# Patient Record
Sex: Male | Born: 1945 | Hispanic: Yes | Marital: Married | Smoking: Current some day smoker
Health system: Southern US, Community
[De-identification: ages and names within clinical notes are randomized; demographics above are authoritative.]

## PROBLEM LIST (undated history)

## (undated) DIAGNOSIS — K7460 Unspecified cirrhosis of liver: Secondary | ICD-10-CM

## (undated) DIAGNOSIS — I5189 Other ill-defined heart diseases: Secondary | ICD-10-CM

## (undated) DIAGNOSIS — E8809 Other disorders of plasma-protein metabolism, not elsewhere classified: Secondary | ICD-10-CM

## (undated) DIAGNOSIS — F1010 Alcohol abuse, uncomplicated: Secondary | ICD-10-CM

## (undated) DIAGNOSIS — Z9081 Acquired absence of spleen: Secondary | ICD-10-CM

## (undated) DIAGNOSIS — K766 Portal hypertension: Secondary | ICD-10-CM

## (undated) DIAGNOSIS — D649 Anemia, unspecified: Secondary | ICD-10-CM

## (undated) DIAGNOSIS — Z720 Tobacco use: Secondary | ICD-10-CM

## (undated) DIAGNOSIS — R768 Other specified abnormal immunological findings in serum: Secondary | ICD-10-CM

## (undated) DIAGNOSIS — L02213 Cutaneous abscess of chest wall: Secondary | ICD-10-CM

## (undated) DIAGNOSIS — K259 Gastric ulcer, unspecified as acute or chronic, without hemorrhage or perforation: Secondary | ICD-10-CM

## (undated) DIAGNOSIS — K3189 Other diseases of stomach and duodenum: Secondary | ICD-10-CM

## (undated) DIAGNOSIS — K2210 Ulcer of esophagus without bleeding: Secondary | ICD-10-CM

## (undated) DIAGNOSIS — A048 Other specified bacterial intestinal infections: Secondary | ICD-10-CM

## (undated) DIAGNOSIS — K254 Chronic or unspecified gastric ulcer with hemorrhage: Secondary | ICD-10-CM

---

## 1968-12-31 DIAGNOSIS — Z9081 Acquired absence of spleen: Secondary | ICD-10-CM

## 1968-12-31 DIAGNOSIS — K259 Gastric ulcer, unspecified as acute or chronic, without hemorrhage or perforation: Secondary | ICD-10-CM

## 1968-12-31 HISTORY — DX: Acquired absence of spleen: Z90.81

## 1968-12-31 HISTORY — DX: Gastric ulcer, unspecified as acute or chronic, without hemorrhage or perforation: K25.9

## 1969-05-02 HISTORY — PX: SPLENECTOMY, TOTAL: SHX788

## 2013-03-12 ENCOUNTER — Encounter (INDEPENDENT_AMBULATORY_CARE_PROVIDER_SITE_OTHER): Payer: Self-pay

## 2013-03-12 ENCOUNTER — Encounter: Payer: Self-pay | Admitting: Family Medicine

## 2013-03-12 ENCOUNTER — Inpatient Hospital Stay (HOSPITAL_COMMUNITY)
Admission: EM | Admit: 2013-03-12 | Discharge: 2013-03-15 | DRG: 378 | Disposition: A | Discharge status: Home / Self Care | Payer: Medicare Other | Attending: Internal Medicine | Admitting: Internal Medicine

## 2013-03-12 ENCOUNTER — Encounter (HOSPITAL_COMMUNITY): Payer: Self-pay | Admitting: Emergency Medicine

## 2013-03-12 ENCOUNTER — Ambulatory Visit (INDEPENDENT_AMBULATORY_CARE_PROVIDER_SITE_OTHER): Payer: Medicare Other | Admitting: Family Medicine

## 2013-03-12 ENCOUNTER — Emergency Department (HOSPITAL_COMMUNITY): Payer: Medicare Other

## 2013-03-12 VITALS — BP 142/92 | HR 103 | Temp 98.1°F | Ht 68.0 in | Wt 174.0 lb

## 2013-03-12 DIAGNOSIS — I5189 Other ill-defined heart diseases: Secondary | ICD-10-CM

## 2013-03-12 DIAGNOSIS — B1920 Unspecified viral hepatitis C without hepatic coma: Secondary | ICD-10-CM | POA: Diagnosis present

## 2013-03-12 DIAGNOSIS — K208 Other esophagitis without bleeding: Secondary | ICD-10-CM | POA: Diagnosis present

## 2013-03-12 DIAGNOSIS — K3189 Other diseases of stomach and duodenum: Secondary | ICD-10-CM

## 2013-03-12 DIAGNOSIS — K2100 Gastro-esophageal reflux disease with esophagitis, without bleeding: Secondary | ICD-10-CM | POA: Diagnosis present

## 2013-03-12 DIAGNOSIS — R600 Localized edema: Secondary | ICD-10-CM

## 2013-03-12 DIAGNOSIS — K921 Melena: Secondary | ICD-10-CM

## 2013-03-12 DIAGNOSIS — R188 Other ascites: Secondary | ICD-10-CM

## 2013-03-12 DIAGNOSIS — K922 Gastrointestinal hemorrhage, unspecified: Secondary | ICD-10-CM

## 2013-03-12 DIAGNOSIS — I864 Gastric varices: Secondary | ICD-10-CM

## 2013-03-12 DIAGNOSIS — K766 Portal hypertension: Secondary | ICD-10-CM | POA: Diagnosis present

## 2013-03-12 DIAGNOSIS — Z720 Tobacco use: Secondary | ICD-10-CM

## 2013-03-12 DIAGNOSIS — A048 Other specified bacterial intestinal infections: Secondary | ICD-10-CM

## 2013-03-12 DIAGNOSIS — K2210 Ulcer of esophagus without bleeding: Secondary | ICD-10-CM

## 2013-03-12 DIAGNOSIS — R768 Other specified abnormal immunological findings in serum: Secondary | ICD-10-CM

## 2013-03-12 DIAGNOSIS — E8809 Other disorders of plasma-protein metabolism, not elsewhere classified: Secondary | ICD-10-CM

## 2013-03-12 DIAGNOSIS — Z23 Encounter for immunization: Secondary | ICD-10-CM

## 2013-03-12 DIAGNOSIS — K254 Chronic or unspecified gastric ulcer with hemorrhage: Principal | ICD-10-CM

## 2013-03-12 DIAGNOSIS — F1010 Alcohol abuse, uncomplicated: Secondary | ICD-10-CM | POA: Diagnosis present

## 2013-03-12 DIAGNOSIS — K7030 Alcoholic cirrhosis of liver without ascites: Secondary | ICD-10-CM | POA: Diagnosis present

## 2013-03-12 DIAGNOSIS — R609 Edema, unspecified: Secondary | ICD-10-CM

## 2013-03-12 DIAGNOSIS — F17200 Nicotine dependence, unspecified, uncomplicated: Secondary | ICD-10-CM | POA: Diagnosis present

## 2013-03-12 DIAGNOSIS — K449 Diaphragmatic hernia without obstruction or gangrene: Secondary | ICD-10-CM | POA: Diagnosis present

## 2013-03-12 DIAGNOSIS — D62 Acute posthemorrhagic anemia: Secondary | ICD-10-CM

## 2013-03-12 DIAGNOSIS — E871 Hypo-osmolality and hyponatremia: Secondary | ICD-10-CM

## 2013-03-12 DIAGNOSIS — K319 Disease of stomach and duodenum, unspecified: Secondary | ICD-10-CM | POA: Diagnosis present

## 2013-03-12 DIAGNOSIS — K7460 Unspecified cirrhosis of liver: Secondary | ICD-10-CM

## 2013-03-12 DIAGNOSIS — F1020 Alcohol dependence, uncomplicated: Secondary | ICD-10-CM | POA: Diagnosis present

## 2013-03-12 HISTORY — DX: Other specified bacterial intestinal infections: A04.8

## 2013-03-12 HISTORY — DX: Gastric ulcer, unspecified as acute or chronic, without hemorrhage or perforation: K25.9

## 2013-03-12 HISTORY — DX: Other diseases of stomach and duodenum: K31.89

## 2013-03-12 HISTORY — DX: Other specified abnormal immunological findings in serum: R76.8

## 2013-03-12 HISTORY — DX: Alcohol abuse, uncomplicated: F10.10

## 2013-03-12 HISTORY — DX: Unspecified cirrhosis of liver: K74.60

## 2013-03-12 HISTORY — DX: Other disorders of plasma-protein metabolism, not elsewhere classified: E88.09

## 2013-03-12 HISTORY — DX: Other ill-defined heart diseases: I51.89

## 2013-03-12 HISTORY — DX: Anemia, unspecified: D64.9

## 2013-03-12 HISTORY — DX: Ulcer of esophagus without bleeding: K22.10

## 2013-03-12 HISTORY — DX: Chronic or unspecified gastric ulcer with hemorrhage: K25.4

## 2013-03-12 HISTORY — DX: Acquired absence of spleen: Z90.81

## 2013-03-12 HISTORY — DX: Portal hypertension: K76.6

## 2013-03-12 HISTORY — DX: Tobacco use: Z72.0

## 2013-03-12 LAB — CBC WITH DIFFERENTIAL/PLATELET
Basophils Relative: 0 % (ref 0–1)
Eosinophils Absolute: 0.1 10*3/uL (ref 0.0–0.7)
Hemoglobin: 12.9 g/dL — ABNORMAL LOW (ref 13.0–17.0)
Lymphocytes Relative: 33 % (ref 12–46)
Lymphs Abs: 2.3 10*3/uL (ref 0.7–4.0)
MCH: 35 pg — ABNORMAL HIGH (ref 26.0–34.0)
MCHC: 35 g/dL (ref 30.0–36.0)
Monocytes Relative: 10 % (ref 3–12)
Neutrophils Relative %: 56 % (ref 43–77)
Platelets: 185 10*3/uL (ref 150–400)
RBC: 3.69 MIL/uL — ABNORMAL LOW (ref 4.22–5.81)
WBC: 7.2 10*3/uL (ref 4.0–10.5)

## 2013-03-12 LAB — POCT CBC
Granulocyte percent: 60.8 %G (ref 37–80)
HCT, POC: 41.6 % — AB (ref 43.5–53.7)
Hemoglobin: 13.5 g/dL — AB (ref 14.1–18.1)
MCV: 103.2 fL — AB (ref 80–97)
POC Granulocyte: 4.7 (ref 2–6.9)
RBC: 4 M/uL — AB (ref 4.69–6.13)

## 2013-03-12 LAB — COMPREHENSIVE METABOLIC PANEL
ALT: 23 U/L (ref 0–53)
Alkaline Phosphatase: 105 U/L (ref 39–117)
BUN: 11 mg/dL (ref 6–23)
CO2: 25 mEq/L (ref 19–32)
Chloride: 99 mEq/L (ref 96–112)
GFR calc Af Amer: >90 mL/min (ref >90)
GFR calc non Af Amer: >90 mL/min (ref >90)
Glucose, Bld: 120 mg/dL — ABNORMAL HIGH (ref 70–99)
Potassium: 3.4 mEq/L — ABNORMAL LOW (ref 3.5–5.1)
Sodium: 132 mEq/L — ABNORMAL LOW (ref 135–145)
Total Bilirubin: 1 mg/dL (ref 0.3–1.2)
Total Protein: 7 g/dL (ref 6.0–8.3)

## 2013-03-12 LAB — URINALYSIS W MICROSCOPIC + REFLEX CULTURE
Glucose, UA: NEGATIVE mg/dL
Protein, ur: >300 mg/dL — AB
Specific Gravity, Urine: 1.02 (ref 1.005–1.030)
Urobilinogen, UA: 4 mg/dL — ABNORMAL HIGH (ref 0.0–1.0)
pH: 6.5 (ref 5.0–8.0)

## 2013-03-12 LAB — LIPASE, BLOOD: Lipase: 34 U/L (ref 11–59)

## 2013-03-12 LAB — PROTIME-INR: Prothrombin Time: 15.1 seconds (ref 11.6–15.2)

## 2013-03-12 MED ORDER — IOHEXOL 300 MG/ML  SOLN
50.0000 mL | Freq: Once | INTRAMUSCULAR | Status: AC | PRN
  Administered 2013-03-12: 50 mL via ORAL

## 2013-03-12 MED ORDER — IOHEXOL 300 MG/ML  SOLN
100.0000 mL | Freq: Once | INTRAMUSCULAR | Status: AC | PRN
  Administered 2013-03-12: 100 mL via INTRAVENOUS

## 2013-03-12 MED ORDER — LORAZEPAM 0.5 MG PO TABS
0.5000 mg | ORAL_TABLET | Freq: Four times a day (QID) | ORAL | Status: DC | PRN
  Administered 2013-03-14: 0.5 mg via ORAL
  Filled 2013-03-12: qty 1

## 2013-03-12 MED ORDER — ACETAMINOPHEN 650 MG RE SUPP: 650.0000 mg | Freq: Four times a day (QID) | RECTAL | Status: DC | PRN

## 2013-03-12 MED ORDER — PANTOPRAZOLE SODIUM 40 MG IV SOLR
40.0000 mg | Freq: Two times a day (BID) | INTRAVENOUS | Status: DC
  Administered 2013-03-13 – 2013-03-15 (×6): 40 mg via INTRAVENOUS
  Filled 2013-03-12 (×6): qty 40

## 2013-03-12 MED ORDER — SODIUM CHLORIDE 0.9 % IV SOLN: INTRAVENOUS | Status: DC

## 2013-03-12 MED ORDER — ACETAMINOPHEN 325 MG PO TABS: 650.0000 mg | ORAL_TABLET | Freq: Four times a day (QID) | ORAL | Status: DC | PRN

## 2013-03-12 MED ORDER — SODIUM CHLORIDE 0.9 % IJ SOLN
3.0000 mL | Freq: Two times a day (BID) | INTRAMUSCULAR | Status: DC
  Administered 2013-03-13 – 2013-03-15 (×5): 3 mL via INTRAVENOUS

## 2013-03-12 MED ORDER — ONDANSETRON HCL 4 MG PO TABS: 4.0000 mg | ORAL_TABLET | Freq: Four times a day (QID) | ORAL | Status: DC | PRN

## 2013-03-12 MED ORDER — SODIUM CHLORIDE 0.9 % IV SOLN
INTRAVENOUS | Status: DC
  Administered 2013-03-13: 02:00:00 via INTRAVENOUS

## 2013-03-12 MED ORDER — THIAMINE HCL 100 MG/ML IJ SOLN
100.0000 mg | Freq: Every day | INTRAMUSCULAR | Status: DC
  Administered 2013-03-13 – 2013-03-14 (×3): 100 mg via INTRAVENOUS
  Filled 2013-03-12 (×3): qty 2

## 2013-03-12 MED ORDER — ONDANSETRON HCL 4 MG/2ML IJ SOLN: 4.0000 mg | Freq: Four times a day (QID) | INTRAMUSCULAR | Status: DC | PRN

## 2013-03-12 NOTE — H&P (Signed)
[facility] History and Physical  CIRE CLUTE ZOX:[id number] DOB: 1945/10/21 DOA: 03/12/2013   PCP: Rudi Heap, MD  Specialists: None  Chief Complaint: Black stools and pain in the abdomen for the last 4-5 weeks  HPI: Aaron Gordon is a 67 y.o. male with no diagnosed medical problems. He actually doesn't seek medical care on a regular basis. Patient tells me that he has been having abdominal pain in the lower abdomen for [redacted] weeks along with black stools. The abdominal pain would get worse with eating and drinking. Pain was 10 out of 10 in intensity, but now he feels slightly improved. Denies taking any Pepto-Bismol. Hasn't seen any frank blood in his stool. Denies any nausea, or vomiting, or any blood in emesis. He has had some dry heaves however. Denies taking any iron pills. Denies any acid reflux. He's had poor oral intake and loss of appetite. Unable to tell me if he is having weight loss. He also mentions, chest pain on and off for the last 4 or 5 weeks without being able to characterize this pain any further. Patient is not a very good historian.  Home Medications: Prior to Admission medications   Not on File    Allergies: No Known Allergies  Past Medical History: Past Medical History  Diagnosis Date  . Stomach ulcer 1970's    Past Surgical History  Procedure Laterality Date  . 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 He smokes half a pack of cigarettes on a daily basis. He used to drink 2-3 beers on a daily basis, but hasn't had anything to drink in the last 3 weeks. Denies any illicit drug use. Usually independent with daily activities.  Family History:  Family History  Problem Relation Age of Onset  . Cancer Mother     intestinal     Review of Systems - History obtained from the patient General ROS: positive for  - fatigue Psychological ROS: negative Ophthalmic ROS:  negative ENT ROS: negative Allergy and Immunology ROS: negative Hematological and Lymphatic ROS: negative Endocrine ROS: negative Respiratory ROS: no cough, shortness of breath, or wheezing Cardiovascular ROS: as in hpi Gastrointestinal ROS: as in hpi Genito-Urinary ROS: no dysuria, trouble voiding, or hematuria Musculoskeletal ROS: negative Neurological ROS: no TIA or stroke symptoms Dermatological ROS: negative  Physical Examination  Filed Vitals:   03/12/13 1643 03/12/13 2042 03/12/13 2232  BP: 146/86 146/92 137/76  Pulse: 110 80 78  Temp: 98.3 F (36.8 C)  98.1 F (36.7 C)  TempSrc: Oral  Oral  Resp: 16 20 20   Height:   5\' 8"  (1.727 m)  Weight:   80 kg (176 lb 5.9 oz)  SpO2: 99% 97% 95%    General appearance: alert, cooperative, appears stated age and no distress Head: Normocephalic, without obvious abnormality, atraumatic Eyes: conjunctivae/corneas clear. PERRL, EOM's intact.  Throat: lips, mucosa, and tongue normal; teeth and gums normal Neck: no adenopathy, no carotid bruit, no JVD, supple, symmetrical, trachea midline and thyroid not enlarged, symmetric, no tenderness/mass/nodules Resp: clear to auscultation bilaterally Cardio: regular rate and rhythm, S1, S2 normal, no murmur, click, rub or gallop GI: Soft slightly distended, vaguely tender diffusely. No rebound, rigidity, or guarding. No masses, organomegaly. Bowel sounds present. Extremities: edema 2+ pitting edema bilateral lower extremities Pulses: 2+ and symmetric Skin: Skin color, texture, turgor normal. No rashes or lesions Lymph nodes: Cervical, supraclavicular, and axillary nodes normal.  Neurologic: Alert and oriented x3. No focal neurological deficits are present  Laboratory Data: Results for orders placed during the hospital encounter of 03/12/13 (from the past 48 hour(s))  CBC WITH DIFFERENTIAL     Status: Abnormal   Collection Time    03/12/13  5:40 PM      Result Value Range   WBC 7.2  4.0 -  10.5 K/uL   RBC 3.69 (*) 4.22 - 5.81 MIL/uL   Hemoglobin 12.9 (*) 13.0 - 17.0 g/dL   HCT 11.9 (*) 14.7 - 82.9 %   MCV 100.0  78.0 - 100.0 fL   MCH 35.0 (*) 26.0 - 34.0 pg   MCHC 35.0  30.0 - 36.0 g/dL   RDW 56.2  13.0 - 86.5 %   Platelets 185  150 - 400 K/uL   Neutrophils Relative % 56  43 - 77 %   Neutro Abs 4.0  1.7 - 7.7 K/uL   Lymphocytes Relative 33  12 - 46 %   Lymphs Abs 2.3  0.7 - 4.0 K/uL   Monocytes Relative 10  3 - 12 %   Monocytes Absolute 0.7  0.1 - 1.0 K/uL   Eosinophils Relative 1  0 - 5 %   Eosinophils Absolute 0.1  0.0 - 0.7 K/uL   Basophils Relative 0  0 - 1 %   Basophils Absolute 0.0  0.0 - 0.1 K/uL  COMPREHENSIVE METABOLIC PANEL     Status: Abnormal   Collection Time    03/12/13  5:40 PM      Result Value Range   Sodium 132 (*) 135 - 145 mEq/L   Potassium 3.4 (*) 3.5 - 5.1 mEq/L   Chloride 99  96 - 112 mEq/L   CO2 25  19 - 32 mEq/L   Glucose, Bld 120 (*) 70 - 99 mg/dL   BUN 11  6 - 23 mg/dL   Creatinine, Ser 7.84  0.50 - 1.35 mg/dL   Calcium 8.3 (*) 8.4 - 10.5 mg/dL   Total Protein 7.0  6.0 - 8.3 g/dL   Albumin 1.8 (*) 3.5 - 5.2 g/dL   AST 52 (*) 0 - 37 U/L   ALT 23  0 - 53 U/L   Alkaline Phosphatase 105  39 - 117 U/L   Total Bilirubin 1.0  0.3 - 1.2 mg/dL   GFR calc non Af Amer >90  >90 mL/min   GFR calc Af Amer >90  >90 mL/min   Comment: (NOTE)     The eGFR has been calculated using the CKD EPI equation.     This calculation has not been validated in all clinical situations.     eGFR's persistently <90 mL/min signify possible Chronic Kidney     Disease.  LIPASE, BLOOD     Status: None   Collection Time    03/12/13  5:40 PM      Result Value Range   Lipase 34  11 - 59 U/L  TROPONIN I     Status: None   Collection Time    03/12/13  5:40 PM      Result Value Range   Troponin I <0.30  <0.30 ng/mL   Comment:            Due to the release kinetics of cTnI,     a negative result within the first hours     of the onset of symptoms does not rule  out     myocardial infarction with certainty.  If myocardial infarction is still suspected,     repeat the test at appropriate intervals.  ETHANOL     Status: Abnormal   Collection Time    03/12/13  5:40 PM      Result Value Range   Alcohol, Ethyl (B) 18 (*) 0 - 11 mg/dL   Comment:            LOWEST DETECTABLE LIMIT FOR     SERUM ALCOHOL IS 11 mg/dL     FOR MEDICAL PURPOSES ONLY  PROTIME-INR     Status: None   Collection Time    03/12/13  5:40 PM      Result Value Range   Prothrombin Time 15.1  11.6 - 15.2 seconds   INR 1.22  0.00 - 1.49  URINALYSIS W MICROSCOPIC + REFLEX CULTURE     Status: Abnormal   Collection Time    03/12/13  9:04 PM      Result Value Range   Color, Urine BROWN (*) YELLOW   Comment: BIOCHEMICALS MAY BE AFFECTED BY COLOR   APPearance CLOUDY (*) CLEAR   Specific Gravity, Urine 1.020  1.005 - 1.030   pH 6.5  5.0 - 8.0   Glucose, UA NEGATIVE  NEGATIVE mg/dL   Hgb urine dipstick MODERATE (*) NEGATIVE   Bilirubin Urine MODERATE (*) NEGATIVE   Ketones, ur NEGATIVE  NEGATIVE mg/dL   Protein, ur >161 (*) NEGATIVE mg/dL   Urobilinogen, UA 4.0 (*) 0.0 - 1.0 mg/dL   Nitrite NEGATIVE  NEGATIVE   Leukocytes, UA NEGATIVE  NEGATIVE   WBC, UA 3-6  <3 WBC/hpf   RBC / HPF 3-6  <3 RBC/hpf   Urine-Other MUCOUS PRESENT      Radiology Reports: Dg Chest 2 View  03/12/2013   CLINICAL DATA:  Weakness and abdominal pain.  Smoker.  EXAM: CHEST  2 VIEW  COMPARISON:  None.  FINDINGS: Normal-sized heart. Clear lungs. Diffuse peribronchial thickening and prominence of the interstitial markings. Right diaphragmatic eventrations. Mild thoracic spine degenerative changes.  IMPRESSION: No acute abnormality.  Changes of chronic bronchitis.   Electronically Signed   By: Gordan Payment M.D.   On: 03/12/2013 19:47   Ct Abdomen Pelvis W Contrast  03/12/2013   CLINICAL DATA:  Left lower quadrant abdominal pain. Black stools. Previous splenectomy.  EXAM: CT ABDOMEN AND PELVIS WITH CONTRAST   TECHNIQUE: Multidetector CT imaging of the abdomen and pelvis was performed using the standard protocol following bolus administration of intravenous contrast.  CONTRAST:  50mL OMNIPAQUE IOHEXOL 300 MG/ML SOLN, OMNIPAQUE IOHEXOL 300 MG/ML SOLN  COMPARISON:  None.  FINDINGS: Lobulated liver contours with a small right lobe and enlarged lateral segment left lobe and caudate lobe. Upper abdominal varices. Moderate amount of free peritoneal fluid.  Multiple gallstones in the gallbladder measuring up to 1.6 cm in diameter each. No gallbladder wall thickening or pericholecystic fluid.  Small amount of atelectasis at both lung bases, greater on the right. Surgically absent spleen. Unremarkable pancreas, adrenal glands, kidneys and urinary bladder. Mildly enlarged prostate gland containing coarse calcifications.  No gastrointestinal abnormalities or enlarged lymph nodes. Atheromatous arterial calcifications. Lumbar and lower thoracic spine degenerative changes, including facet degenerative changes with associated grade 1 anterolisthesis at the L4-5 level and grade 1 retrolisthesis at the L1-2 and L2-3 levels.  IMPRESSION: 1. 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 2. Status post splenectomy. 3. Cholelithiasis.   Electronically Signed   By: Brett Canales  Azucena Kuba M.D.   On: 03/12/2013 20:28    Electrocardiogram: Sinus rhythm left anterior fascicular block. Normal intervals. No concerning ST or T-wave changes are noted. No older EKGs available for comparison.  Problem List  Principal Problem:   Melena Active Problems:   Alcohol abuse   Liver cirrhosis   Pedal edema   Assessment: This is a 67 year old male, who presents with the few weeks history of abdominal pain, black stool, and chest pain. Hemoglobin is close to normal and his MCV is 100. CT scan was done, which suggested liver cirrhosis with upper abdominal varices. Liver cirrhosis is most likely alcoholic  considering his history. It's unlikely he's had significant GI bleeding because his hemoglobin would have been lower. He does have abdominal pain, and moderate ascites, however, has normal white count and is afebrile. SBP is less likely.  Plan: #1 history of melena: ED physician did a rectal exam which was heme positive. But the stool was brown in color. Since the CT scan did show varices we will keep him n.p.o. and consult gastroenterology. Keep him on PPI for now, but no clear role for octreotide at this time. He tells me that he has a history of stomach ulcer, although he's unable to specify further.  #2 lower abdominal pain: Etiology unclear. Unlikely to be SBP. CT does not show any findings that could account for this pain. He does have cholelithiasis, but his LFTs and not significantly abnormal. Will defer further management to gastroenterology. Continue PPI for now.  #3 chest pain: Very atypical. Possibly related to GI in etiology. Troponin is normal. EKG is nonischemic. Continue to monitor for now.  #4 alcoholic liver cirrhosis noted on CT with severe hypoalbuminemia and ascites: He has evidence for portal hypertension. Will check hepatitis panel in the morning. May benefit from Lasix which will be initiated. Defer further management to gastroenterology.   #5 pedal edema: Most likely due to hypoalbuminemia. Will check venous Dopplers and echocardiogram.   #6 alcohol abuse: He denies having had anything alcoholic to consume the last 3 weeks. We will give him thiamine. He'll be given Ativan as needed. Check Mg.   DVT Prophylaxis: TED stockings for now Code Status: Full code Family Communication: Discussed with the patient  Disposition Plan: Admit to telemetry   Further management decisions will depend on results of further testing and patient's response to treatment.  [facility]  [facility] Pager [phone]  If 7PM-7AM, please contact  night-coverage [email or web address] Password [email or web address]  03/12/2013, 11:59 PM

## 2013-03-12 NOTE — Progress Notes (Signed)
New Patient History and Physical  Patient name: Aaron Gordon Medical record number: [id number] Date of birth: 08/21/1945 Age: 67 y.o. Gender: male  Primary Care Provider: Rudi Heap, MD  Chief Complaint: melena History of Present Illness:This is a 67 y.o. year old male with significant past medical history of stomach ulcers status post repair, alcohol abuse, tobacco abuse presenting with melanotic stools x1 week. Patient reports that he has a remote history of stomach ulcers status post questionable surgical repair. This was done in [facility] several years ago. Patient denies any recent NSAID or aspirin use. Patient states he drinks at least 2-3 beers per day. No hard liquor. Patient states he noticed black stools about 2 weeks ago. Patient states he stopped drinking from that point. Has still been having some dark tan stools. Has also had some decreased appetite. No vomiting. Pt states that he has not had medical care since hospitalization for stomach ulcers several years ago.    Past Medical History: There are no active problems to display for this patient.  History reviewed. No pertinent past medical history.  Past Surgical History: Past Surgical History  Procedure Laterality Date  . Splenectomy, total  1971  . Stomach ulcer      Social History: History   Social History  . Marital Status: Unknown    Spouse Name: N/A    Number of Children: N/A  . Years of Education: N/A   Social History Main Topics  . Smoking status: Current Every Day Smoker -- 0.50 packs/day    Types: Cigarettes  . Smokeless tobacco: None  . Alcohol Use: No  . Drug Use: No  . Sexual Activity: None   Other Topics Concern  . None   Social History Narrative  . None    Family History: Family History  Problem Relation Age of Onset  . Cancer Mother     intestinal    Allergies: No Known Allergies  No current outpatient prescriptions on file.   No current facility-administered medications  for this visit.   Review Of Systems: 12 point ROS negative except as noted above in HPI.  Physical Exam: Filed Vitals:   03/12/13 1211  BP: 142/92  Pulse: 103  Temp: 98.1 F (36.7 C)    General: underweight, dissheveled appearing  HEENT: PERRLA, extra ocular movement intact, sclera clear, anicteric and poor dentition  Heart: S1, S2 normal, no murmur, rub or gallop, regular rate and rhythm Lungs: clear to auscultation, no wheezes or rales and unlabored breathing Abdomen:+ midline abdominal incision. + mild TTP in lower abdomen, most predominant in LLQ  Extremities: extremities normal, atraumatic, no cyanosis or edema Skin:no rashes, no ecchymoses, no jaundice Neurology: normal without focal findings  Labs and Imaging:  Lab Results  Component Value Date   WBC 7.8 03/12/2013   HGB 13.5* 03/12/2013   HCT 41.6* 03/12/2013   MCV 103.2* 03/12/2013    Hemoccult: Positive   Assessment and Plan: Melena  Given overall history and presentation today, there is high concern for recurrence of upper GI bleed. Hemoccult-positive today. Hgb fairly stable today at 13.5 with otherwise stable vital signs apart from mild tachycardia.  Will send patient to the hospital via EMS for further evaluation as patient has had overall poor medical care in the setting of multiple medical issues for several years given active blood in stool.  Update: Pt refused EMS transport. AMA paperwork signed. Stressed with pt importance of emergent evaluation given hx/o stomach ulcers s/p surgical repairs, ETOH history and  heme positive stools. Pt expressed understanding.         Doree Albee MD

## 2013-03-12 NOTE — ED Notes (Addendum)
Pt presents at request of [facility] due to positive hemacult and abdominal pain x 3 weeks. Pt states pain has worsened throughout the week.  Pt reports abdominal pain in the epigastric region. Denies bloody emesis. Does report seeing blood in stool for 2 weeks. NAD  Noted at this time.

## 2013-03-12 NOTE — ED Provider Notes (Signed)
CSN: [id number]     Arrival date & time 03/12/13  1637 History   First MD Initiated Contact with Patient 03/12/13 1645     Chief Complaint  Patient presents with  . Abdominal Pain    HPI Pt was seen at 1650. Per pt, c/o gradual onset and persistence of constant abd and chest "pains" for the past 3 to 4 months, worse over the past 2 weeks. Has been associated with intermittent "black" and "bloody" stools. Endorses hx of "stomach ulcers" and feels "I might have another one." Pt was last evaluated for same at [facility] hospital approximately 3 years ago, but cannot recall what they did for him. Did not f/u with GI MD or PMD. States he was evaluated at [facility] today and sent to the ED for further evaluation. Denies palpitations, no SOB/cough, no back pain, no N/V/D, no fevers.    Past Medical History  Diagnosis Date  . Stomach ulcer 1970's   Past Surgical History  Procedure Laterality Date  . Splenectomy, total  1971   Family History  Problem Relation Age of Onset  . Cancer Mother     intestinal   History  Substance Use Topics  . Smoking status: Current Every Day Smoker -- 0.50 packs/day    Types: Cigarettes  . Smokeless tobacco: Not on file  . Alcohol Use: Yes     Comment: daily up until 2 weeks ago    Review of Systems ROS: Statement: All systems negative except as marked or noted in the HPI; Constitutional: Negative for fever and chills. ; ; Eyes: Negative for eye pain, redness and discharge. ; ; ENMT: Negative for ear pain, hoarseness, nasal congestion, sinus pressure and sore throat. ; ; Cardiovascular: +CP. Negative for palpitations, diaphoresis, dyspnea and peripheral edema. ; ; Respiratory: Negative for cough, wheezing and stridor. ; ; Gastrointestinal: +abd pain, "black stools," blood in stools. Negative for nausea, vomiting, diarrhea, hematemesis, jaundice.. ; ; Genitourinary: Negative for dysuria, flank pain and hematuria. ; ; Musculoskeletal: Negative for back pain and neck  pain. Negative for swelling and trauma.; ; Skin: Negative for pruritus, rash, abrasions, blisters, bruising and skin lesion.; ; Neuro: Negative for headache, lightheadedness and neck stiffness. Negative for weakness, altered level of consciousness , altered mental status, extremity weakness, paresthesias, involuntary movement, seizure and syncope.     Allergies  Review of patient's allergies indicates no known allergies.  Home Medications  No current outpatient prescriptions on file. BP 146/86  Pulse 110  Temp(Src) 98.3 F (36.8 C) (Oral)  Resp 16  SpO2 99% Physical Exam 1655: Physical examination:  Nursing notes reviewed; Vital signs and O2 SAT reviewed;  Constitutional: Well developed, Well nourished, Well hydrated, In no acute distress; Head:  Normocephalic, atraumatic; Eyes: EOMI, PERRL, No scleral icterus; ENMT: Mouth and pharynx normal, Mucous membranes moist; Neck: Supple, Full range of motion, No lymphadenopathy; Cardiovascular: Tachycardic rate and rhythm, No gallop; Respiratory: Breath sounds clear & equal bilaterally, No wheezes.  Speaking full sentences with ease, Normal respiratory effort/excursion; Chest: Nontender, Movement normal; Abdomen: Soft, +LUQ and LLQ tenderness to palp. No rebound or guarding. Nondistended, Normal bowel sounds. Rectal exam performed w/permission of pt and ED RN chaperone present.  Anal tone normal.  Non-tender, soft brown stool in rectal vault, heme positive.  No fissures, no external hemorrhoids, no palp masses.; Genitourinary: No CVA tenderness; Extremities: Pulses normal, No tenderness, No edema, No calf edema or asymmetry.; Neuro: AA&Ox3, vague historian. Major CN grossly intact.  Speech clear. Climbs on  and off stretcher easily by himself. Gait steady. No gross focal motor or sensory deficits in extremities.; Skin: Color normal, Warm, Dry.   ED Course  Procedures  EKG Interpretation     Ventricular Rate:  94 PR Interval:  144 QRS  Duration: 86 QT Interval:  384 QTC Calculation: 480 R Axis:   -55 Text Interpretation:  Normal sinus rhythm Left axis deviation Left anterior fascicular block Prolonged QT No previous ECGs available            MDM  MDM Reviewed: previous chart, nursing note and vitals Interpretation: labs, x-ray, CT scan and ECG   Results for orders placed during the hospital encounter of 03/12/13  CBC WITH DIFFERENTIAL      Result Value Range   WBC 7.2  4.0 - 10.5 K/uL   RBC 3.69 (*) 4.22 - 5.81 MIL/uL   Hemoglobin 12.9 (*) 13.0 - 17.0 g/dL   HCT 45.4 (*) 09.8 - 11.9 %   MCV 100.0  78.0 - 100.0 fL   MCH 35.0 (*) 26.0 - 34.0 pg   MCHC 35.0  30.0 - 36.0 g/dL   RDW 14.7  82.9 - 56.2 %   Platelets 185  150 - 400 K/uL   Neutrophils Relative % 56  43 - 77 %   Neutro Abs 4.0  1.7 - 7.7 K/uL   Lymphocytes Relative 33  12 - 46 %   Lymphs Abs 2.3  0.7 - 4.0 K/uL   Monocytes Relative 10  3 - 12 %   Monocytes Absolute 0.7  0.1 - 1.0 K/uL   Eosinophils Relative 1  0 - 5 %   Eosinophils Absolute 0.1  0.0 - 0.7 K/uL   Basophils Relative 0  0 - 1 %   Basophils Absolute 0.0  0.0 - 0.1 K/uL  COMPREHENSIVE METABOLIC PANEL      Result Value Range   Sodium 132 (*) 135 - 145 mEq/L   Potassium 3.4 (*) 3.5 - 5.1 mEq/L   Chloride 99  96 - 112 mEq/L   CO2 25  19 - 32 mEq/L   Glucose, Bld 120 (*) 70 - 99 mg/dL   BUN 11  6 - 23 mg/dL   Creatinine, Ser 1.30  0.50 - 1.35 mg/dL   Calcium 8.3 (*) 8.4 - 10.5 mg/dL   Total Protein 7.0  6.0 - 8.3 g/dL   Albumin 1.8 (*) 3.5 - 5.2 g/dL   AST 52 (*) 0 - 37 U/L   ALT 23  0 - 53 U/L   Alkaline Phosphatase 105  39 - 117 U/L   Total Bilirubin 1.0  0.3 - 1.2 mg/dL   GFR calc non Af Amer >90  >90 mL/min   GFR calc Af Amer >90  >90 mL/min  LIPASE, BLOOD      Result Value Range   Lipase 34  11 - 59 U/L  TROPONIN I      Result Value Range   Troponin I <0.30  <0.30 ng/mL  ETHANOL      Result Value Range   Alcohol, Ethyl (B) 18 (*) 0 - 11 mg/dL  PROTIME-INR       Result Value Range   Prothrombin Time 15.1  11.6 - 15.2 seconds   INR 1.22  0.00 - 1.49   Dg Chest 2 View 03/12/2013   CLINICAL DATA:  Weakness and abdominal pain.  Smoker.  EXAM: CHEST  2 VIEW  COMPARISON:  None.  FINDINGS: Normal-sized heart. Clear lungs. Diffuse  peribronchial thickening and prominence of the interstitial markings. Right diaphragmatic eventrations. Mild thoracic spine degenerative changes.  IMPRESSION: No acute abnormality.  Changes of chronic bronchitis.   Electronically Signed   By: Gordan Payment M.D.   On: 03/12/2013 19:47   Ct Abdomen Pelvis W Contrast 03/12/2013   CLINICAL DATA:  Left lower quadrant abdominal pain. Black stools. Previous splenectomy.  EXAM: CT ABDOMEN AND PELVIS WITH CONTRAST  TECHNIQUE: Multidetector CT imaging of the abdomen and pelvis was performed using the standard protocol following bolus administration of intravenous contrast.  CONTRAST:  50mL OMNIPAQUE IOHEXOL 300 MG/ML SOLN, OMNIPAQUE IOHEXOL 300 MG/ML SOLN  COMPARISON:  None.  FINDINGS: Lobulated liver contours with a small right lobe and enlarged lateral segment left lobe and caudate lobe. Upper abdominal varices. Moderate amount of free peritoneal fluid.  Multiple gallstones in the gallbladder measuring up to 1.6 cm in diameter each. No gallbladder wall thickening or pericholecystic fluid.  Small amount of atelectasis at both lung bases, greater on the right. Surgically absent spleen. Unremarkable pancreas, adrenal glands, kidneys and urinary bladder. Mildly enlarged prostate gland containing coarse calcifications.  No gastrointestinal abnormalities or enlarged lymph nodes. Atheromatous arterial calcifications. Lumbar and lower thoracic spine degenerative changes, including facet degenerative changes with associated grade 1 anterolisthesis at the L4-5 level and grade 1 retrolisthesis at the L1-2 and L2-3 levels.  IMPRESSION: 1. Changes of cirrhosis of the liver with upper abdominal varices and  moderate amount of ascites. 2. Status post splenectomy. 3. Cholelithiasis.   Electronically Signed   By: Gordan Payment M.D.   On: 03/12/2013 20:28    2050:  No N/V or stooling while in the ED. Hx etoh abuse, initially tachycardic, but gradually improved during ED stay. SBP stable in 140's. CT with cirrhosis of liver with varices; concern bleeding may be related to same.  Dx and testing d/w pt and family.  Questions answered.  Verb understanding, agreeable to admit. T/C to [facility]. Rito Ehrlich, case discussed, including:  HPI, pertinent PM/SHx, VS/PE, dx testing, ED course and treatment:  Agreeable to admit, requests to write temporary orders, obtain tele inpt bed to team 2.   Laray Anger, DO 03/13/13 Babette Relic

## 2013-03-12 NOTE — Patient Instructions (Signed)
Discharge Against Medical Advice °I am signing this paper to show that I am leaving this hospital or health care center of my own free will. It is done against all medical advice. In doing so, I am releasing this hospital or health care center and the attending physicians from any and all claims that I may want to make. °I understand that further care has been recommended. My condition may worsen. This could cause me further bodily injury, illness, or even death. I do know that the medical staff has fully explained to me the risk that I am taking in leaving against medical advice. °Document Released: 04/18/2005 Document Revised: 07/11/2011 Document Reviewed: 10/03/2006 °ExitCare® Patient Information ©2014 [employer]. ° °

## 2013-03-12 NOTE — ED Notes (Signed)
Pt c/o abd/chest pain for "a long time" was seen at [facility] today and sent to er for further evaluation.

## 2013-03-13 ENCOUNTER — Inpatient Hospital Stay (HOSPITAL_COMMUNITY): Payer: Medicare Other

## 2013-03-13 ENCOUNTER — Encounter (HOSPITAL_COMMUNITY)
Admission: EM | Disposition: A | Discharge status: Home / Self Care | Payer: Self-pay | Source: Home / Self Care | Attending: Internal Medicine

## 2013-03-13 ENCOUNTER — Encounter (HOSPITAL_COMMUNITY): Payer: Self-pay | Admitting: *Deleted

## 2013-03-13 DIAGNOSIS — I8500 Esophageal varices without bleeding: Secondary | ICD-10-CM

## 2013-03-13 DIAGNOSIS — K921 Melena: Secondary | ICD-10-CM

## 2013-03-13 DIAGNOSIS — R188 Other ascites: Secondary | ICD-10-CM

## 2013-03-13 DIAGNOSIS — K7030 Alcoholic cirrhosis of liver without ascites: Secondary | ICD-10-CM

## 2013-03-13 DIAGNOSIS — K449 Diaphragmatic hernia without obstruction or gangrene: Secondary | ICD-10-CM

## 2013-03-13 DIAGNOSIS — K766 Portal hypertension: Secondary | ICD-10-CM

## 2013-03-13 DIAGNOSIS — K208 Other esophagitis without bleeding: Secondary | ICD-10-CM

## 2013-03-13 DIAGNOSIS — I868 Varicose veins of other specified sites: Secondary | ICD-10-CM

## 2013-03-13 DIAGNOSIS — Z8711 Personal history of peptic ulcer disease: Secondary | ICD-10-CM

## 2013-03-13 DIAGNOSIS — K253 Acute gastric ulcer without hemorrhage or perforation: Secondary | ICD-10-CM

## 2013-03-13 DIAGNOSIS — I369 Nonrheumatic tricuspid valve disorder, unspecified: Secondary | ICD-10-CM

## 2013-03-13 DIAGNOSIS — K922 Gastrointestinal hemorrhage, unspecified: Secondary | ICD-10-CM

## 2013-03-13 HISTORY — PX: ESOPHAGOGASTRODUODENOSCOPY: SHX5428

## 2013-03-13 LAB — COMPREHENSIVE METABOLIC PANEL
ALT: 17 U/L (ref 0–53)
AST: 40 U/L — ABNORMAL HIGH (ref 0–37)
Alkaline Phosphatase: 79 U/L (ref 39–117)
CO2: 26 mEq/L (ref 19–32)
Creatinine, Ser: 0.71 mg/dL (ref 0.50–1.35)
GFR calc Af Amer: >90 mL/min (ref >90)
GFR calc non Af Amer: >90 mL/min (ref >90)
Glucose, Bld: 86 mg/dL (ref 70–99)
Potassium: 3.7 mEq/L (ref 3.5–5.1)
Sodium: 132 mEq/L — ABNORMAL LOW (ref 135–145)
Total Protein: 5.5 g/dL — ABNORMAL LOW (ref 6.0–8.3)

## 2013-03-13 LAB — HEMOGLOBIN AND HEMATOCRIT, BLOOD
HCT: 31.1 % — ABNORMAL LOW (ref 39.0–52.0)
Hemoglobin: 10.8 g/dL — ABNORMAL LOW (ref 13.0–17.0)

## 2013-03-13 LAB — HEPATITIS PANEL, ACUTE: Hepatitis B Surface Ag: NEGATIVE

## 2013-03-13 LAB — CBC
HCT: 29.9 % — ABNORMAL LOW (ref 39.0–52.0)
MCH: 35.1 pg — ABNORMAL HIGH (ref 26.0–34.0)
MCHC: 35.1 g/dL (ref 30.0–36.0)
MCV: 100 fL (ref 78.0–100.0)
RBC: 2.99 MIL/uL — ABNORMAL LOW (ref 4.22–5.81)
RDW: 13.8 % (ref 11.5–15.5)

## 2013-03-13 LAB — VITAMIN B12: Vitamin B-12: 1034 pg/mL — ABNORMAL HIGH (ref 211–911)

## 2013-03-13 SURGERY — EGD (ESOPHAGOGASTRODUODENOSCOPY)
Anesthesia: Moderate Sedation

## 2013-03-13 MED ORDER — SODIUM CHLORIDE 0.9 % IV SOLN
INTRAVENOUS | Status: DC
  Administered 2013-03-13: 15:00:00 via INTRAVENOUS

## 2013-03-13 MED ORDER — MEPERIDINE HCL 50 MG/ML IJ SOLN
INTRAMUSCULAR | Status: AC
  Filled 2013-03-13: qty 1

## 2013-03-13 MED ORDER — DEXTROSE 5 % IV SOLN
1.0000 g | INTRAVENOUS | Status: DC
  Administered 2013-03-13 – 2013-03-14 (×2): 1 g via INTRAVENOUS
  Filled 2013-03-13 (×5): qty 10

## 2013-03-13 MED ORDER — MIDAZOLAM HCL 5 MG/5ML IJ SOLN
INTRAMUSCULAR | Status: AC
  Filled 2013-03-13: qty 10

## 2013-03-13 MED ORDER — POTASSIUM CHLORIDE CRYS ER 20 MEQ PO TBCR
40.0000 meq | EXTENDED_RELEASE_TABLET | Freq: Once | ORAL | Status: AC
  Administered 2013-03-13: 40 meq via ORAL
  Filled 2013-03-13: qty 2

## 2013-03-13 MED ORDER — STERILE WATER FOR IRRIGATION IR SOLN
Status: DC | PRN
  Administered 2013-03-13: 15:00:00

## 2013-03-13 MED ORDER — FUROSEMIDE 10 MG/ML IJ SOLN
40.0000 mg | Freq: Two times a day (BID) | INTRAMUSCULAR | Status: DC
  Administered 2013-03-13 – 2013-03-15 (×6): 40 mg via INTRAVENOUS
  Filled 2013-03-13 (×6): qty 4

## 2013-03-13 MED ORDER — MIDAZOLAM HCL 5 MG/5ML IJ SOLN
INTRAMUSCULAR | Status: DC | PRN
  Administered 2013-03-13 (×2): 2 mg via INTRAVENOUS

## 2013-03-13 MED ORDER — MEPERIDINE HCL 50 MG/ML IJ SOLN
INTRAMUSCULAR | Status: DC | PRN
  Administered 2013-03-13: 25 mg via INTRAVENOUS

## 2013-03-13 MED ORDER — BUTAMBEN-TETRACAINE-BENZOCAINE 2-2-14 % EX AERO
INHALATION_SPRAY | CUTANEOUS | Status: DC | PRN
  Administered 2013-03-13: 2 via TOPICAL

## 2013-03-13 MED ORDER — METOCLOPRAMIDE HCL 5 MG/ML IJ SOLN
5.0000 mg | Freq: Four times a day (QID) | INTRAMUSCULAR | Status: DC
  Administered 2013-03-13 – 2013-03-15 (×8): 5 mg via INTRAVENOUS
  Filled 2013-03-13 (×8): qty 2

## 2013-03-13 MED ORDER — SUCRALFATE 1 GM/10ML PO SUSP
1.0000 g | Freq: Three times a day (TID) | ORAL | Status: DC
  Administered 2013-03-13 – 2013-03-15 (×8): 1 g via ORAL
  Filled 2013-03-13 (×8): qty 10

## 2013-03-13 MED ORDER — BUTAMBEN-TETRACAINE-BENZOCAINE 2-2-14 % EX AERO
INHALATION_SPRAY | CUTANEOUS | Status: AC
  Filled 2013-03-13: qty 56

## 2013-03-13 NOTE — Consult Note (Signed)
Reason for Consult: melena Referring Physician: Hospitalist  Aaron Gordon is an 67 y.o. male.  HPI: Admitted thru the ED yesterday with c/o melena. Apparently he saw [name] at [facility]  yesterday and was advised that he needed admission for hx of melena. He apparently does not see PCP on a regular basis.  He has hx of stomach ulcers. Diagnosed at [facility] 3 yrs ago. ? EGD. He drinks 3-4 beers a day. He denies liquor. He denies NSAIDs.  He started having black stools about 5 weeks ago. Stopped drinking 3 weeks ago. He says he went to the [facility] this and his stool was not black.His appetite has not been good. He cannot tell me if he has lost any weight. He c/o lower abdominal pain this am. He denies having a fever.  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  He underwent a CT while in the ED which revealed: IMPRESSION: 1. 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 2. Status post splenectomy. 3. Cholelithiasis.   Electronically Signed   By: Aaron Payment M.D.   On: 03/12/2013 20:28  He has had a drop in his hemoglobin. Stool was guaiac positive in the ED.  This am he also c/o chest pain x 3 weeks. Describes as a tightness. He has undergone 2 Troponins and both were normal.  He smokes about 10-12 cigarettes a day.  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   CBC    Component Value Date/Time   WBC 6.8 03/13/2013 0548   WBC 7.8 03/12/2013 1312   RBC 2.99* 03/13/2013 0548   RBC 4.0* 03/12/2013 1312   HGB 10.5* 03/13/2013 0548   HGB 13.5* 03/12/2013 1312   HCT 29.9* 03/13/2013 0548   HCT 41.6* 03/12/2013 1312   PLT 159 03/13/2013 0548   MCV 100.0 03/13/2013 0548   MCV 103.2* 03/12/2013 1312   MCH 35.1* 03/13/2013 0548   MCH 33.5* 03/12/2013 1312   MCHC 35.1 03/13/2013 0548   MCHC 32.5 03/12/2013 1312   RDW 13.8 03/13/2013 0548   LYMPHSABS 2.3 03/12/2013 1740   MONOABS 0.7 03/12/2013 1740   EOSABS 0.1 03/12/2013 1740   BASOSABS 0.0  03/12/2013 1740            Past Medical History  Diagnosis Date  . Stomach ulcer 1970's    Past Surgical History  Procedure Laterality Date  . Splenectomy, total  1971    Family History  Problem Relation Age of Onset  . Cancer Mother     intestinal    Social History:  reports that he has been smoking Cigarettes.  He has been smoking about 0.50 packs per day. He does not have any smokeless tobacco history on file. He reports that he drinks alcohol. He reports that he does not use illicit drugs.  Allergies: No Known Allergies  Medications: I have reviewed the patient's current medications.  Results for orders placed during the hospital encounter of 03/12/13 (from the past 48 hour(s))  CBC WITH DIFFERENTIAL     Status: Abnormal   Collection Time    03/12/13  5:40 PM      Result Value Range   WBC 7.2  4.0 - 10.5 K/uL   RBC 3.69 (*) 4.22 - 5.81 MIL/uL   Hemoglobin 12.9 (*) 13.0 - 17.0 g/dL   HCT 04.5 (*) 40.9 - 81.1 %   MCV 100.0  78.0 - 100.0 fL  MCH 35.0 (*) 26.0 - 34.0 pg   MCHC 35.0  30.0 - 36.0 g/dL   RDW 44.0  34.7 - 42.5 %   Platelets 185  150 - 400 K/uL   Neutrophils Relative % 56  43 - 77 %   Neutro Abs 4.0  1.7 - 7.7 K/uL   Lymphocytes Relative 33  12 - 46 %   Lymphs Abs 2.3  0.7 - 4.0 K/uL   Monocytes Relative 10  3 - 12 %   Monocytes Absolute 0.7  0.1 - 1.0 K/uL   Eosinophils Relative 1  0 - 5 %   Eosinophils Absolute 0.1  0.0 - 0.7 K/uL   Basophils Relative 0  0 - 1 %   Basophils Absolute 0.0  0.0 - 0.1 K/uL  COMPREHENSIVE METABOLIC PANEL     Status: Abnormal   Collection Time    03/12/13  5:40 PM      Result Value Range   Sodium 132 (*) 135 - 145 mEq/L   Potassium 3.4 (*) 3.5 - 5.1 mEq/L   Chloride 99  96 - 112 mEq/L   CO2 25  19 - 32 mEq/L   Glucose, Bld 120 (*) 70 - 99 mg/dL   BUN 11  6 - 23 mg/dL   Creatinine, Ser 9.56  0.50 - 1.35 mg/dL   Calcium 8.3 (*) 8.4 - 10.5 mg/dL   Total Protein 7.0  6.0 - 8.3 g/dL   Albumin 1.8 (*) 3.5 - 5.2  g/dL   AST 52 (*) 0 - 37 U/L   ALT 23  0 - 53 U/L   Alkaline Phosphatase 105  39 - 117 U/L   Total Bilirubin 1.0  0.3 - 1.2 mg/dL   GFR calc non Af Amer >90  >90 mL/min   GFR calc Af Amer >90  >90 mL/min   Comment: (NOTE)     The eGFR has been calculated using the CKD EPI equation.     This calculation has not been validated in all clinical situations.     eGFR's persistently <90 mL/min signify possible Chronic Kidney     Disease.  LIPASE, BLOOD     Status: None   Collection Time    03/12/13  5:40 PM      Result Value Range   Lipase 34  11 - 59 U/L  TROPONIN I     Status: None   Collection Time    03/12/13  5:40 PM      Result Value Range   Troponin I <0.30  <0.30 ng/mL   Comment:            Due to the release kinetics of cTnI,     a negative result within the first hours     of the onset of symptoms does not rule out     myocardial infarction with certainty.     If myocardial infarction is still suspected,     repeat the test at appropriate intervals.  ETHANOL     Status: Abnormal   Collection Time    03/12/13  5:40 PM      Result Value Range   Alcohol, Ethyl (B) 18 (*) 0 - 11 mg/dL   Comment:            LOWEST DETECTABLE LIMIT FOR     SERUM ALCOHOL IS 11 mg/dL     FOR MEDICAL PURPOSES ONLY  PROTIME-INR     Status: None   Collection Time  03/12/13  5:40 PM      Result Value Range   Prothrombin Time 15.1  11.6 - 15.2 seconds   INR 1.22  0.00 - 1.49  URINALYSIS W MICROSCOPIC + REFLEX CULTURE     Status: Abnormal   Collection Time    03/12/13  9:04 PM      Result Value Range   Color, Urine BROWN (*) YELLOW   Comment: BIOCHEMICALS MAY BE AFFECTED BY COLOR   APPearance CLOUDY (*) CLEAR   Specific Gravity, Urine 1.020  1.005 - 1.030   pH 6.5  5.0 - 8.0   Glucose, UA NEGATIVE  NEGATIVE mg/dL   Hgb urine dipstick MODERATE (*) NEGATIVE   Bilirubin Urine MODERATE (*) NEGATIVE   Ketones, ur NEGATIVE  NEGATIVE mg/dL   Protein, ur >161 (*) NEGATIVE mg/dL    Urobilinogen, UA 4.0 (*) 0.0 - 1.0 mg/dL   Nitrite NEGATIVE  NEGATIVE   Leukocytes, UA NEGATIVE  NEGATIVE   WBC, UA 3-6  <3 WBC/hpf   RBC / HPF 3-6  <3 RBC/hpf   Urine-Other MUCOUS PRESENT    MAGNESIUM     Status: None   Collection Time    03/13/13 12:00 AM      Result Value Range   Magnesium 1.9  1.5 - 2.5 mg/dL  COMPREHENSIVE METABOLIC PANEL     Status: Abnormal   Collection Time    03/13/13  5:48 AM      Result Value Range   Sodium 132 (*) 135 - 145 mEq/L   Potassium 3.7  3.5 - 5.1 mEq/L   Chloride 100  96 - 112 mEq/L   CO2 26  19 - 32 mEq/L   Glucose, Bld 86  70 - 99 mg/dL   BUN 9  6 - 23 mg/dL   Creatinine, Ser 0.96  0.50 - 1.35 mg/dL   Calcium 7.5 (*) 8.4 - 10.5 mg/dL   Total Protein 5.5 (*) 6.0 - 8.3 g/dL   Albumin 1.5 (*) 3.5 - 5.2 g/dL   AST 40 (*) 0 - 37 U/L   ALT 17  0 - 53 U/L   Alkaline Phosphatase 79  39 - 117 U/L   Total Bilirubin 1.0  0.3 - 1.2 mg/dL   GFR calc non Af Amer >90  >90 mL/min   GFR calc Af Amer >90  >90 mL/min   Comment: (NOTE)     The eGFR has been calculated using the CKD EPI equation.     This calculation has not been validated in all clinical situations.     eGFR's persistently <90 mL/min signify possible Chronic Kidney     Disease.  CBC     Status: Abnormal   Collection Time    03/13/13  5:48 AM      Result Value Range   WBC 6.8  4.0 - 10.5 K/uL   RBC 2.99 (*) 4.22 - 5.81 MIL/uL   Hemoglobin 10.5 (*) 13.0 - 17.0 g/dL   Comment: DELTA CHECK NOTED   HCT 29.9 (*) 39.0 - 52.0 %   MCV 100.0  78.0 - 100.0 fL   MCH 35.1 (*) 26.0 - 34.0 pg   MCHC 35.1  30.0 - 36.0 g/dL   RDW 04.5  40.9 - 81.1 %   Platelets 159  150 - 400 K/uL  TROPONIN I     Status: None   Collection Time    03/13/13  5:48 AM      Result Value Range   Troponin I <  0.30  <0.30 ng/mL   Comment:            Due to the release kinetics of cTnI,     a negative result within the first hours     of the onset of symptoms does not rule out     myocardial infarction with  certainty.     If myocardial infarction is still suspected,     repeat the test at appropriate intervals.    Dg Chest 2 View  03/12/2013   CLINICAL DATA:  Weakness and abdominal pain.  Smoker.  EXAM: CHEST  2 VIEW  COMPARISON:  None.  FINDINGS: Normal-sized heart. Clear lungs. Diffuse peribronchial thickening and prominence of the interstitial markings. Right diaphragmatic eventrations. Mild thoracic spine degenerative changes.  IMPRESSION: No acute abnormality.  Changes of chronic bronchitis.   Electronically Signed   By: Aaron Payment M.D.   On: 03/12/2013 19:47   Ct Abdomen Pelvis W Contrast  03/12/2013   CLINICAL DATA:  Left lower quadrant abdominal pain. Black stools. Previous splenectomy.  EXAM: CT ABDOMEN AND PELVIS WITH CONTRAST  TECHNIQUE: Multidetector CT imaging of the abdomen and pelvis was performed using the standard protocol following bolus administration of intravenous contrast.  CONTRAST:  50mL OMNIPAQUE IOHEXOL 300 MG/ML SOLN, OMNIPAQUE IOHEXOL 300 MG/ML SOLN  COMPARISON:  None.  FINDINGS: Lobulated liver contours with a small right lobe and enlarged lateral segment left lobe and caudate lobe. Upper abdominal varices. Moderate amount of free peritoneal fluid.  Multiple gallstones in the gallbladder measuring up to 1.6 cm in diameter each. No gallbladder wall thickening or pericholecystic fluid.  Small amount of atelectasis at both lung bases, greater on the right. Surgically absent spleen. Unremarkable pancreas, adrenal glands, kidneys and urinary bladder. Mildly enlarged prostate gland containing coarse calcifications.  No gastrointestinal abnormalities or enlarged lymph nodes. Atheromatous arterial calcifications. Lumbar and lower thoracic spine degenerative changes, including facet degenerative changes with associated grade 1 anterolisthesis at the L4-5 level and grade 1 retrolisthesis at the L1-2 and L2-3 levels.  IMPRESSION: 1. Changes of cirrhosis of the liver with upper  abdominal varices and moderate amount of ascites. 2. Status post splenectomy. 3. Cholelithiasis.   Electronically Signed   By: Aaron Payment M.D.   On: 03/12/2013 20:28    ROS Blood pressure 128/76, pulse 81, temperature 98.5 F (36.9 C), temperature source Oral, resp. rate 20, height 5\' 8"  (1.727 m), weight 176 lb 5.9 oz (80 kg), SpO2 96.00%. Physical Exam Alert and oriented. Skin warm and dry. Oral mucosa is moist.   . Sclera anicteric, conjunctivae is pink. Thyroid not enlarged. No cervical lymphadenopathy. Lungs clear. Heart regular rate and rhythm.  Abdomen is soft. Bowel sounds are positive. No hepatomegaly. No abdominal masses felt. Tenderness to his lower abdomen.  Minimal  edema to lower extremities.  Assessment/Plan: Cirrhosis: probably from etoh abuse.Patient states he has not drank in several weeks, but his etoh was 18 on admission. Hx of drug abuse years ago.  Hepatitis Panel is pending. Melena: He has had a drop in his hemoglobin. He c/o lower abdominal pain. Will discuss with Dr. Karilyn Cota possible EGD. Agree with IV Protonix.  Will get [facility]  SETZER,TERRI W 03/13/2013, 8:24 AM     GI attending note; Patient interviewed and examined. Lab studies reviewed as well as abdominopelvic CT. Patient has been passing tarry stools intermittently for the last 4 weeks. He has history of peptic ulcer disease for which she was treated at [facility]  few years ago. He is hemodynamically stable. Suspect GI bleed secondary to peptic ulcer disease but vasal bleed is also in differential. Patient is agreeable to undergo EGD with therapeutic intention.

## 2013-03-13 NOTE — Progress Notes (Addendum)
[facility] PROGRESS NOTE  Aaron Gordon WUJ:[id number] DOB: November 26, 1945 DOA: 03/12/2013 PCP: Rudi Heap, MD  Assessment/Plan: 1. Cirrhosis, likely from heavy alcohol use over last 15 years - pt reports that he has stopped 3 weeks ago, alcohol withdrawal protocol. Thiamine ordered.  2. Melena - GI consult pending, likely will go for EGD, NPO for now, IV protonix.  3. Anemia - monitoring CBC 4. Ascites - Pt just back from abd ultrasound, results pending, diuretic started. Following lytes.  5. LE edema - likely from hypoalbuminemia, Echo pending, venous dopplers pending.  6. Abdominal Pain - pt reporting that symptoms have improved.  7. Tobacco user - The patient was counseled on the dangers of tobacco use, and was advised to quit.  Reviewed strategies to maximize success, including removing cigarettes and smoking materials from environment, stress management and support of family/friends.   Code Status: Full  Family Communication: No family at bedside  Disposition Plan: Pending further consultation, tests and findings   Consultants:  GI  Procedures:  Venous Dopplers  Echocardiogram  Possible EGD   HPI/Subjective: Pt reports that he feels unchanged from yesterday.    Objective: Filed Vitals:   03/13/13 0500  BP: 128/76  Pulse:   Temp:   Resp:     Intake/Output Summary (Last 24 hours) at 03/13/13 1137 Last data filed at 03/13/13 0410  Gross per 24 hour  Intake      0 ml  Output   1250 ml  Net  -1250 ml   Filed Weights   03/12/13 2232  Weight: 176 lb 5.9 oz (80 kg)    Exam:   General:  Awake, alert, no distress  Cardiovascular: normal s1, s2 sounds   Respiratory: BBS clear  Abdomen: mild distension, nontender, normal BS  Musculoskeletal: 1+ pitting distal tibial edema bilateral   Data Reviewed: Basic Metabolic Panel:  Recent Labs Lab 03/12/13 1740 03/13/13 03/13/13 0548  NA 132*  --  132*  K 3.4*  --  3.7  CL 99  --  100  CO2 25  --   26  GLUCOSE 120*  --  86  BUN 11  --  9  CREATININE 0.73  --  0.71  CALCIUM 8.3*  --  7.5*  MG  --  1.9  --    Liver Function Tests:  Recent Labs Lab 03/12/13 1740 03/13/13 0548  AST 52* 40*  ALT 23 17  ALKPHOS 105 79  BILITOT 1.0 1.0  PROT 7.0 5.5*  ALBUMIN 1.8* 1.5*    Recent Labs Lab 03/12/13 1740  LIPASE 34   No results found for this basename: AMMONIA,  in the last 168 hours CBC:  Recent Labs Lab 03/12/13 1740 03/13/13 0548  WBC 7.2 6.8  NEUTROABS 4.0  --   HGB 12.9* 10.5*  HCT 36.9* 29.9*  MCV 100.0 100.0  PLT 185 159   Cardiac Enzymes:  Recent Labs Lab 03/12/13 1740 03/13/13 0548  TROPONINI <0.30 <0.30   BNP (last 3 results) No results found for this basename: PROBNP,  in the last 8760 hours CBG: No results found for this basename: GLUCAP,  in the last 168 hours  No results found for this or any previous visit (from the past 240 hour(s)).   Studies: Dg Chest 2 View  03/12/2013   CLINICAL DATA:  Weakness and abdominal pain.  Smoker.  EXAM: CHEST  2 VIEW  COMPARISON:  None.  FINDINGS: Normal-sized heart. Clear lungs. Diffuse peribronchial thickening and prominence of the interstitial  markings. Right diaphragmatic eventrations. Mild thoracic spine degenerative changes.  IMPRESSION: No acute abnormality.  Changes of chronic bronchitis.   Electronically Signed   By: Gordan Payment M.D.   On: 03/12/2013 19:47   Ct Abdomen Pelvis W Contrast  03/12/2013   CLINICAL DATA:  Left lower quadrant abdominal pain. Black stools. Previous splenectomy.  EXAM: CT ABDOMEN AND PELVIS WITH CONTRAST  TECHNIQUE: Multidetector CT imaging of the abdomen and pelvis was performed using the standard protocol following bolus administration of intravenous contrast.  CONTRAST:  50mL OMNIPAQUE IOHEXOL 300 MG/ML SOLN, OMNIPAQUE IOHEXOL 300 MG/ML SOLN  COMPARISON:  None.  FINDINGS: Lobulated liver contours with a small right lobe and enlarged lateral segment left lobe and caudate  lobe. Upper abdominal varices. Moderate amount of free peritoneal fluid.  Multiple gallstones in the gallbladder measuring up to 1.6 cm in diameter each. No gallbladder wall thickening or pericholecystic fluid.  Small amount of atelectasis at both lung bases, greater on the right. Surgically absent spleen. Unremarkable pancreas, adrenal glands, kidneys and urinary bladder. Mildly enlarged prostate gland containing coarse calcifications.  No gastrointestinal abnormalities or enlarged lymph nodes. Atheromatous arterial calcifications. Lumbar and lower thoracic spine degenerative changes, including facet degenerative changes with associated grade 1 anterolisthesis at the L4-5 level and grade 1 retrolisthesis at the L1-2 and L2-3 levels.  IMPRESSION: 1. 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 2. Status post splenectomy. 3. Cholelithiasis.   Electronically Signed   By: Gordan Payment M.D.   On: 03/12/2013 20:28    Scheduled Meds: . furosemide  40 mg Intravenous Q12H  . pantoprazole (PROTONIX) IV  40 mg Intravenous Q12H  . sodium chloride  3 mL Intravenous Q12H  . thiamine  100 mg Intravenous Daily   Continuous Infusions: . sodium chloride 20 mL/hr at 03/13/13 0150    Principal Problem:   Melena Active Problems:   Alcohol abuse   Liver cirrhosis   Pedal edema   [facility]  [facility] Pager [phone]. If 7PM-7AM, please contact night-coverage at [email or web address], password [email or web address] 03/13/2013, 11:37 AM  LOS: 1 day

## 2013-03-13 NOTE — Care Management Note (Addendum)
    Page 1 of 1   03/15/2013     10:47:52 AM   CARE MANAGEMENT NOTE 03/15/2013  Patient:  Aaron Gordon, Aaron Gordon   Account Number:  [id number]  Date Initiated:  03/13/2013  Documentation initiated by:  Sharrie Rothman  Subjective/Objective Assessment:   Pt admitted from home with abd pain and gi bleeding. Pt lives with his wife and will return home at discharge. Pt is independent with ADL's.     Action/Plan:   No CM needs noted.   Anticipated DC Date:  03/15/2013   Anticipated DC Plan:  HOME/SELF CARE      DC Planning Services  CM consult      Choice offered to / List presented to:             Status of service:  Completed, signed off Medicare Important Message given?  YES (If response is "NO", the following Medicare IM given date fields will be blank) Date Medicare IM given:  03/15/2013 Date Additional Medicare IM given:    Discharge Disposition:  HOME/SELF CARE  Per UR Regulation:    If discussed at Long Length of Stay Meetings, dates discussed:    Comments:  03/15/13 1045 Arlyss Queen, RN BSN CM Pt potential discharge today. No CM needs noted.  03/13/13 1400 Arlyss Queen, RN BSN CM

## 2013-03-13 NOTE — Progress Notes (Signed)
UR chart review completed.  

## 2013-03-13 NOTE — Op Note (Signed)
EGD PROCEDURE REPORT  PATIENT:  Aaron Gordon  MR#:  [id number] Birthdate:  Apr 03, 1946, 67 y.o., male Endoscopist:  Dr. Malissa Hippo, MD Referred By:  Dr. Rolly Salter, MD  Procedure Date: 03/13/2013  Procedure:   EGD  Indications:  Patient is 67 year old male who presents with melena and anemia of he has history of gastric ulcer which was diagnosed in May 2011 when he presented more or less with similar symptomatology. His H. pylori serology was positive. Patient never returned for reevaluation on treatment. Now he also has developed cirrhosis most likely secondary to excessive ethanol use. He is undergoing EGD with therapeutic intention.            Informed Consent:  The risks, benefits, alternatives & imponderables which include, but are not limited to, bleeding, infection, perforation, drug reaction and potential missed lesion have been reviewed.  The potential for biopsy, lesion removal, esophageal dilation, etc. have also been discussed.  Questions have been answered.  All parties agreeable.  Please see history & physical in medical record for more information.  Medications:  Demerol 25 mg IV Versed 4 mg IV Cetacaine spray topically for oropharyngeal anesthesia  Description of procedure:  The endoscope was introduced through the mouth and advanced to the second portion of the duodenum without difficulty or limitations. The mucosal surfaces were surveyed very carefully during advancement of the scope and upon withdrawal.  Findings:  Esophagus:  Mucosa of the proximal and middle segment was normal. Scattered erosions noted involving the distal 3 cm of the esophageal mucosa. GEJ:  36 cm Hiatus:  39 cm Stomach:  Stomach was empty and distended very well with insufflation. Folds in the proximal stomach were unremarkable. Mucosa at gastric body revealed mosaic pattern. Large ulcer noted antrum involving 50% of the circumference at least 4 x 6 cm. One area in the middle of ulcer with  bluish discoloration but no obvious vessel noted. No bleeding induced with washing. Therefore no therapy rendered. Pyloric channel was patent. Fundus and cardia was examined by retroflex the scope and there were no fundal varices. Duodenum:  Normal bulbar and post bulbar mucosa.  Therapeutic/Diagnostic Maneuvers Performed:  None  Complications:  None  Impression: Erosive reflux esophagitis and a small sliding hiatal hernia. Mild portal gastropathy but no evidence of esophageal or gastric varices. Very large ulcer at gastric antrum doubt active bleed felt to be the source of patient's melena.  Recommendations:  Continue IV pantoprazole for 48 hours. Sucralfate 1 g by mouth a.c. and each bedtime. Metoclopramide 5 mg IV every 6x48 hours. Ceftriaxone 1 g IV every 24 hours. Full liquid diet. Patient will need to be treated for H. pylori infection and will undergo repeat EGD in 6-8 weeks.  Amayra Kiedrowski U  03/13/2013  3:33 PM  CC: Dr. Rudi Heap, MD & Dr. Bonnetta Barry ref. provider found

## 2013-03-13 NOTE — Progress Notes (Signed)
*  PRELIMINARY RESULTS* Echocardiogram 2D Echocardiogram has been performed.  Aaron Gordon 03/13/2013, 9:54 AM

## 2013-03-14 LAB — COMPREHENSIVE METABOLIC PANEL
AST: 45 U/L — ABNORMAL HIGH (ref 0–37)
Alkaline Phosphatase: 88 U/L (ref 39–117)
BUN: 9 mg/dL (ref 6–23)
CO2: 27 mEq/L (ref 19–32)
Calcium: 7.6 mg/dL — ABNORMAL LOW (ref 8.4–10.5)
Chloride: 101 mEq/L (ref 96–112)
Creatinine, Ser: 0.71 mg/dL (ref 0.50–1.35)
GFR calc Af Amer: >90 mL/min (ref >90)
GFR calc non Af Amer: >90 mL/min (ref >90)
Potassium: 3.7 mEq/L (ref 3.5–5.1)
Total Bilirubin: 0.8 mg/dL (ref 0.3–1.2)

## 2013-03-14 LAB — CBC
HCT: 31.1 % — ABNORMAL LOW (ref 39.0–52.0)
MCH: 35 pg — ABNORMAL HIGH (ref 26.0–34.0)
MCV: 100.6 fL — ABNORMAL HIGH (ref 78.0–100.0)
RBC: 3.09 MIL/uL — ABNORMAL LOW (ref 4.22–5.81)
WBC: 6 10*3/uL (ref 4.0–10.5)

## 2013-03-14 MED ORDER — VITAMIN B-1 100 MG PO TABS
100.0000 mg | ORAL_TABLET | Freq: Every day | ORAL | Status: DC
  Administered 2013-03-15: 100 mg via ORAL
  Filled 2013-03-14: qty 1

## 2013-03-14 MED ORDER — ALBUMIN HUMAN 25 % IV SOLN
12.5000 g | Freq: Once | INTRAVENOUS | Status: AC
  Administered 2013-03-14: 12.5 g via INTRAVENOUS
  Filled 2013-03-14: qty 50

## 2013-03-14 NOTE — Progress Notes (Addendum)
Patient ID: Aaron Gordon, male   DOB: 1945-06-05, 67 y.o.   MRN: [id number] Denies any pain at this time. Eating breakfast. No Nausea or vomiting.  Noted he has a positive Hep. C antibody. Albumin low at 1.5 Assessment/Plan: Melena. Recent EGD revealed:Impression:  Erosive reflux esophagitis and a small sliding hiatal hernia.  Mild portal gastropathy but no evidence of esophageal or gastric varices.  Very large ulcer at gastric antrum doubt active bleed felt to be the source of patient's melena. Hemoglobin stable at this time. Agree with Protonix. Continue Reglan.  Positive Hep C antibody: Needs Hep C quaint by PCR, Hep C genotype, TSH Will continue to monitor.  Spoke with Hospitalist concerning low Albumin.  GI attending note; Patient seen earlier today. He has no GI complaints. Diet advance to 4 g sodium diet. Patient's current GI problems include upper GI bleed secondary to large gastric ulcer which appears to be chronic, but decompensated cirrhosis with ascites secondary to excessive alcohol use and hepatitis C. 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 Patient also has H. pylori infection to be treated at a later date. Prognosis discussed with the patient. In order to have any chance he must not drink alcohol and should also refrain from using NSAIDs.

## 2013-03-14 NOTE — Progress Notes (Signed)
The patient is receiving thiamine by the intravenous route.  Based on criteria approved by the Pharmacy and Therapeutics Committee and the Medical Executive Committee, the medication is being converted to the equivalent oral dose form.  These criteria include: -No Active GI bleeding -Able to tolerate diet of full liquids (or better) or tube feeding OR able to tolerate other medications by the oral or enteral route  If you have any questions about this conversion, please contact the Pharmacy Department (ext [phone]).  Thank you.  Elson Clan, [facility] 03/14/2013 10:33 AM

## 2013-03-14 NOTE — Progress Notes (Signed)
[facility] PROGRESS NOTE  Aaron Gordon ZOX:[id number] DOB: 1945-07-24 DOA: 03/12/2013 PCP: Rudi Heap, MD  Assessment/Plan: 1. Cirrhosis, likely from heavy alcohol use over last 15 years - pt reports that he has stopped 3 weeks ago, alcohol withdrawal protocol. Thiamine ordered. Ativan prn, appreciate GI following, spoke with GI today and they are recommending dose of albumin 2. Hypoalbuminemia - IV albumin 3. Melena / erosive esophagitis, large gastric ulcer - tolerating liquid diet, IV protonix, metoclopromide, x 48 hours, ceftriaxone to treat for H pylori infection.  4. Anemia - holding stable, monitoring  5. Ascites - Pt just back from abd ultrasound, improving with diuresis, diuretic started. Following lytes.  6. LE edema - likely from hypoalbuminemia, Echo pending, venous dopplers pending.  7. Abdominal Pain - pt reporting that symptoms have improved.  8. Tobacco user - The patient was counseled on the dangers of tobacco use, and was advised to quit. Reviewed strategies to maximize success, including removing cigarettes and smoking materials from environment, stress management and support of family/friends.  Code Status: Full  Family Communication: No family at bedside  Disposition Plan: Pending further consultation, tests and findings  HPI/Subjective: Pt tolerating his diet without problems  Objective: Filed Vitals:   03/14/13 0550  BP: 119/69  Pulse: 77  Temp: 97.9 F (36.6 C)  Resp: 16    Intake/Output Summary (Last 24 hours) at 03/14/13 0821 Last data filed at 03/14/13 0500  Gross per 24 hour  Intake 1736.33 ml  Output   1575 ml  Net 161.33 ml   Filed Weights   03/12/13 2232 03/13/13 1431  Weight: 176 lb 5.9 oz (80 kg) 176 lb (79.833 kg)    Exam:  General: Awake, alert, no distress  Cardiovascular: normal s1, s2 sounds  Respiratory: BBS clear  Abdomen: mild distension, nontender, normal BS  Musculoskeletal: 1+ pitting distal tibial edema bilateral     Data Reviewed: Basic Metabolic Panel:  Recent Labs Lab 03/12/13 1740 03/13/13 03/13/13 0548 03/14/13 0523  NA 132*  --  132* 133*  K 3.4*  --  3.7 3.7  CL 99  --  100 101  CO2 25  --  26 27  GLUCOSE 120*  --  86 77  BUN 11  --  9 9  CREATININE 0.73  --  0.71 0.71  CALCIUM 8.3*  --  7.5* 7.6*  MG  --  1.9  --   --    Liver Function Tests:  Recent Labs Lab 03/12/13 1740 03/13/13 0548 03/14/13 0523  AST 52* 40* 45*  ALT 23 17 17   ALKPHOS 105 79 88  BILITOT 1.0 1.0 0.8  PROT 7.0 5.5* 5.6*  ALBUMIN 1.8* 1.5* 1.5*    Recent Labs Lab 03/12/13 1740  LIPASE 34   No results found for this basename: AMMONIA,  in the last 168 hours CBC:  Recent Labs Lab 03/12/13 1740 03/13/13 0548 03/13/13 2304 03/14/13 0523  WBC 7.2 6.8  --  6.0  NEUTROABS 4.0  --   --   --   HGB 12.9* 10.5* 10.8* 10.8*  HCT 36.9* 29.9* 31.1* 31.1*  MCV 100.0 100.0  --  100.6*  PLT 185 159  --  147*   Cardiac Enzymes:  Recent Labs Lab 03/12/13 1740 03/13/13 0548  TROPONINI <0.30 <0.30   BNP (last 3 results) No results found for this basename: PROBNP,  in the last 8760 hours CBG: No results found for this basename: GLUCAP,  in the last 168 hours  No results found for this or any previous visit (from the past 240 hour(s)).   Studies: Dg Chest 2 View  03/12/2013   CLINICAL DATA:  Weakness and abdominal pain.  Smoker.  EXAM: CHEST  2 VIEW  COMPARISON:  None.  FINDINGS: Normal-sized heart. Clear lungs. Diffuse peribronchial thickening and prominence of the interstitial markings. Right diaphragmatic eventrations. Mild thoracic spine degenerative changes.  IMPRESSION: No acute abnormality.  Changes of chronic bronchitis.   Electronically Signed   By: Gordan Payment M.D.   On: 03/12/2013 19:47   Ct Abdomen Pelvis W Contrast  03/12/2013   CLINICAL DATA:  Left lower quadrant abdominal pain. Black stools. Previous splenectomy.  EXAM: CT ABDOMEN AND PELVIS WITH CONTRAST  TECHNIQUE:  Multidetector CT imaging of the abdomen and pelvis was performed using the standard protocol following bolus administration of intravenous contrast.  CONTRAST:  50mL OMNIPAQUE IOHEXOL 300 MG/ML SOLN, OMNIPAQUE IOHEXOL 300 MG/ML SOLN  COMPARISON:  None.  FINDINGS: Lobulated liver contours with a small right lobe and enlarged lateral segment left lobe and caudate lobe. Upper abdominal varices. Moderate amount of free peritoneal fluid.  Multiple gallstones in the gallbladder measuring up to 1.6 cm in diameter each. No gallbladder wall thickening or pericholecystic fluid.  Small amount of atelectasis at both lung bases, greater on the right. Surgically absent spleen. Unremarkable pancreas, adrenal glands, kidneys and urinary bladder. Mildly enlarged prostate gland containing coarse calcifications.  No gastrointestinal abnormalities or enlarged lymph nodes. Atheromatous arterial calcifications. Lumbar and lower thoracic spine degenerative changes, including facet degenerative changes with associated grade 1 anterolisthesis at the L4-5 level and grade 1 retrolisthesis at the L1-2 and L2-3 levels.  IMPRESSION: 1. 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 2. Status post splenectomy. 3. Cholelithiasis.   Electronically Signed   By: Gordan Payment M.D.   On: 03/12/2013 20:28   US Venous Img Lower Bilateral  03/13/2013   CLINICAL DATA:  Leg swelling  EXAM: VENOUS DOPPLER ULTRASOUND OF BILATERAL LOWER EXTREMITIES  TECHNIQUE: Gray-scale sonography with graded compression, as well as color Doppler and duplex ultrasound, were performed to evaluate the deep venous system from the level of the common femoral vein through the popliteal and proximal calf veins. Spectral Doppler was utilized to evaluate flow at rest and with distal augmentation maneuvers.  COMPARISON:  CT scan abdomen/ pelvis 03/12/2013  FINDINGS: Thrombus within deep veins:  None visualized.  Compressibility  of deep veins:  Normal.  Duplex waveform respiratory phasicity:  Normal.  Duplex waveform response to augmentation:  Normal.  Venous reflux:  None visualized.  Other findings: Bilateral great saphenous veins are patent and compressible. There is edema in the superficial subcutaneous soft tissues of the left calf.  IMPRESSION: No evidence of the DVT in either lower extremity.  Signed,  Sterling Big, MD  [facility]  [facility]   Electronically Signed   By: Malachy Moan M.D.   On: 03/13/2013 14:13    Scheduled Meds: . cefTRIAXone (ROCEPHIN)  IV  1 g Intravenous Q24H  . furosemide  40 mg Intravenous Q12H  . metoCLOPramide (REGLAN) injection  5 mg Intravenous Q6H  . pantoprazole (PROTONIX) IV  40 mg Intravenous Q12H  . sodium chloride  3 mL Intravenous Q12H  . sucralfate  1 g Oral TID WC & HS  . thiamine  100 mg Intravenous Daily   Continuous Infusions:   Principal Problem:   Melena Active  Problems:   Alcohol abuse   Liver cirrhosis   Pedal edema   [facility]  [facility] Pager [phone]. If 7PM-7AM, please contact night-coverage at [email or web address], password [facility] 03/14/2013, 8:21 AM  LOS: 2 days

## 2013-03-15 ENCOUNTER — Encounter (HOSPITAL_COMMUNITY): Payer: Self-pay | Admitting: Internal Medicine

## 2013-03-15 DIAGNOSIS — K2210 Ulcer of esophagus without bleeding: Secondary | ICD-10-CM

## 2013-03-15 DIAGNOSIS — R188 Other ascites: Secondary | ICD-10-CM | POA: Diagnosis present

## 2013-03-15 DIAGNOSIS — K254 Chronic or unspecified gastric ulcer with hemorrhage: Secondary | ICD-10-CM

## 2013-03-15 DIAGNOSIS — R894 Abnormal immunological findings in specimens from other organs, systems and tissues: Secondary | ICD-10-CM

## 2013-03-15 DIAGNOSIS — E871 Hypo-osmolality and hyponatremia: Secondary | ICD-10-CM | POA: Diagnosis present

## 2013-03-15 DIAGNOSIS — D62 Acute posthemorrhagic anemia: Secondary | ICD-10-CM

## 2013-03-15 DIAGNOSIS — R7689 Other specified abnormal immunological findings in serum: Secondary | ICD-10-CM

## 2013-03-15 DIAGNOSIS — A048 Other specified bacterial intestinal infections: Secondary | ICD-10-CM

## 2013-03-15 DIAGNOSIS — R768 Other specified abnormal immunological findings in serum: Secondary | ICD-10-CM

## 2013-03-15 DIAGNOSIS — Z720 Tobacco use: Secondary | ICD-10-CM

## 2013-03-15 DIAGNOSIS — K3189 Other diseases of stomach and duodenum: Secondary | ICD-10-CM

## 2013-03-15 DIAGNOSIS — I5189 Other ill-defined heart diseases: Secondary | ICD-10-CM

## 2013-03-15 DIAGNOSIS — E8809 Other disorders of plasma-protein metabolism, not elsewhere classified: Secondary | ICD-10-CM

## 2013-03-15 HISTORY — DX: Ulcer of esophagus without bleeding: K22.10

## 2013-03-15 HISTORY — DX: Tobacco use: Z72.0

## 2013-03-15 HISTORY — DX: Other disorders of plasma-protein metabolism, not elsewhere classified: E88.09

## 2013-03-15 HISTORY — DX: Chronic or unspecified gastric ulcer with hemorrhage: K25.4

## 2013-03-15 HISTORY — DX: Other ill-defined heart diseases: I51.89

## 2013-03-15 HISTORY — DX: Other specified bacterial intestinal infections: A04.8

## 2013-03-15 HISTORY — DX: Other specified abnormal immunological findings in serum: R76.8

## 2013-03-15 HISTORY — DX: Other specified abnormal immunological findings in serum: R76.89

## 2013-03-15 HISTORY — DX: Other diseases of stomach and duodenum: K31.89

## 2013-03-15 LAB — CBC
Hemoglobin: 10.6 g/dL — ABNORMAL LOW (ref 13.0–17.0)
MCHC: 34.6 g/dL (ref 30.0–36.0)
RBC: 3.03 MIL/uL — ABNORMAL LOW (ref 4.22–5.81)
WBC: 5.7 10*3/uL (ref 4.0–10.5)

## 2013-03-15 LAB — COMPREHENSIVE METABOLIC PANEL
ALT: 17 U/L (ref 0–53)
AST: 51 U/L — ABNORMAL HIGH (ref 0–37)
BUN: 9 mg/dL (ref 6–23)
CO2: 27 mEq/L (ref 19–32)
Calcium: 7.4 mg/dL — ABNORMAL LOW (ref 8.4–10.5)
Creatinine, Ser: 0.77 mg/dL (ref 0.50–1.35)
GFR calc Af Amer: >90 mL/min (ref >90)
GFR calc non Af Amer: >90 mL/min (ref >90)
Glucose, Bld: 100 mg/dL — ABNORMAL HIGH (ref 70–99)
Sodium: 132 mEq/L — ABNORMAL LOW (ref 135–145)

## 2013-03-15 MED ORDER — SPIRONOLACTONE 50 MG PO TABS: 50.0000 mg | ORAL_TABLET | Freq: Every day | ORAL | Status: DC

## 2013-03-15 MED ORDER — PANTOPRAZOLE SODIUM 40 MG PO TBEC: 40.0000 mg | DELAYED_RELEASE_TABLET | Freq: Two times a day (BID) | ORAL | Status: DC

## 2013-03-15 MED ORDER — PNEUMOCOCCAL VAC POLYVALENT 25 MCG/0.5ML IJ INJ
0.5000 mL | INJECTION | Freq: Once | INTRAMUSCULAR | Status: AC
  Administered 2013-03-15: 0.5 mL via INTRAMUSCULAR
  Filled 2013-03-15: qty 0.5

## 2013-03-15 MED ORDER — INFLUENZA VAC SPLIT QUAD 0.5 ML IM SUSP
0.5000 mL | Freq: Once | INTRAMUSCULAR | Status: AC
  Administered 2013-03-15: 0.5 mL via INTRAMUSCULAR
  Filled 2013-03-15: qty 0.5

## 2013-03-15 MED ORDER — SUCRALFATE 1 GM/10ML PO SUSP: 1.0000 g | Freq: Three times a day (TID) | ORAL | Status: DC

## 2013-03-15 MED ORDER — FUROSEMIDE 20 MG PO TABS: 20.0000 mg | ORAL_TABLET | Freq: Every day | ORAL | Status: DC

## 2013-03-15 NOTE — Discharge Summary (Signed)
The patient was seen and examined. His vital signs and laboratory studies were reviewed. He was discussed with nurse practitioner, Ms. Vedia Coffer. Agree with findings, assessment, and plan. The patient was encouraged to stop drinking alcohol entirely and to avoid NSAIDs. He was also encouraged to stop smoking. He was instructed to take his medications as prescribed.

## 2013-03-15 NOTE — Progress Notes (Signed)
D/c instructions reviewed with patient.  Verbalized understanding.  Pt dc'd to home with friend. [place], Candelaria Stagers 03/15/2013

## 2013-03-15 NOTE — Discharge Summary (Signed)
Physician Discharge Summary  Aaron Gordon:[id number] DOB: 10-Dec-1945 DOA: 03/12/2013  PCP: Rudi Heap, MD  Admit date: 03/12/2013 Discharge date: 03/15/2013  Time spent: 40 minutes  Recommendations for Outpatient Follow-up:  1. Follow up with Dr. Karilyn Cota 04/01/13 at 3:15pm 2. Follow up with PCP 1 week for evaluation of symptoms  Discharge Diagnoses:  Principal Problem:   Gastric ulcer with hemorrhage Active Problems:   Melena   Alcohol abuse   Liver cirrhosis   Pedal edema   Erosive esophagitis   Portal hypertensive gastropathy   Acute blood loss anemia   Ascites   Hepatitis C antibody test positive   Hypoalbuminemia   Hyponatremia   Tobacco abuse   H. pylori infection   Diastolic dysfunction  Discharge Condition: stable and ready for discharge  Diet recommendation: low sodium   Filed Weights   03/12/13 2232 03/13/13 1431  Weight: 80 kg (176 lb 5.9 oz) 79.833 kg (176 lb)    History of present illness:  Aaron Gordon is a 67 y.o. male with no diagnosed medical problems. He actually doesn't seek medical care on a regular basis. Pt presented to ED on 03/12/13 with cc abdominal pain.  Patient reported that he had been having abdominal pain in the lower abdomen for [redacted] weeks along with black stools. The abdominal pain would get worse with eating and drinking. Pain was 10 out of 10 in intensity. Denied taking any Pepto-Bismol. Had not seen any frank blood in his stool. Denied any nausea, or vomiting, or any blood in emesis. He had some dry heaves however. Denied taking any iron pills. Denied any acid reflux. He had poor oral intake and loss of appetite. Unable to recall if he was having weight loss. He also mentioned chest pain on and off for the last 4 or 5 weeks without being able to characterize this pain any further. Patient was not a very good historian.   Hospital Course:  1. Gastric ulcer with hemorrhage: s/p endoscopy 03/13/13 per Dr. Karilyn Cota. No bleeding induced with  washing therefore no therapy rendered during procedure. Impression is erosive reflux esophagitis and a small sliding hiatal hernia. Mild portal gastropathy but no evidence of esophageal or gastric varices. Very large ulcer at gastric antrum doubt active bleed per GI. Patient provided with IV protonix for 48 hours after endoscopy as well as reglan IV for 48 hours. Diet advanced slowly. Pt tolerated well. At discharge tolerating low sodium diet. Will be discharged with Protonix and carafate.  2. Cirrhosis, likely from heavy alcohol use over last 15 years -decompensated with ascites. Provided with IV lasix q12 hours. Ascites much improved at discharge. Discussed with GI and will not be discharged on lasix. Has follow up appointment with GI in 2 weeks for evaluation.  3. Hypoalbuminemia - IV albumin given 4. Melena / erosive esophagitis, large gastric ulcer - see #1.   5. Anemia - Stable at 10-11. OP follow up for trending 6. Ascites - Improved with diuresis. At discharge discussed with GI and no lasix recommended at discharge. Pt will follow up in 2 weeks for re-evaluation.   7. LE edema - likely from hypoalbuminemia, Venous dopplers negative for DVT.  2decho with EF 65% and grade 1 diastolic dysfunction. Improved at discharge after diuresis.  8. Abdominal Pain - See #1. Resolved at discharge.  9. Tobacco user - The patient was counseled on the dangers of tobacco use, and was advised to quit. Reviewed strategies to maximize success, including removing cigarettes and  smoking materials from environment, stress management and support of family/friends. 10. Erosive esophagitis : see #1. 11. Portal HTN gastropathy 12. Hepatitis C: follow up with Dr Karilyn Cota 04/01/13. Hepatitis c vrs RNA pending.Hepatitis C genotype pending.  13. H. Pylori: patient received Rocephin for 3 days. Per GI treatment at follow up visit 04/01/13.  Procedures: Endoscopy 03/13/13 Impression: Erosive reflux esophagitis and a small sliding  hiatal hernia. Mild portal gastropathy but no evidence of esophageal or gastric varices. Very large ulcer at gastric antrum doubt active bleed felt to be the source of patient's melena.   2 echo 03/13/13.The estimated ejection fraction was in the range of 60% to 65%. Doppler parameters are consistent with abnormal left ventricular relaxation (grade 1 diastolic dysfunction).   Consultations:  Dr Karilyn Cota Gastroenterology  Discharge Exam: Filed Vitals:   03/15/13 0551  BP: 95/53  Pulse: 83  Temp: 98.2 F (36.8 C)  Resp: 17    General: well nourished NAD Cardiovascular: S1 and S2 No MGR Trace -1+ pitting edema Respiratory: Normal effort BS are clear bilaterally i hear no wheeze or rhonch Abdomen: round but soft. +BS throughout. Non-tender to palpation  Discharge Instructions   Future Appointments Provider Department Dept Phone   04/01/2013 3:30 PM Len Blalock, NP [facility] [phone]       Medication List         pantoprazole 40 MG tablet  Commonly known as:  PROTONIX  Take 1 tablet (40 mg total) by mouth 2 (two) times daily.     sucralfate 1 GM/10ML suspension  Commonly known as:  CARAFATE  Take 10 mLs (1 g total) by mouth 4 (four) times daily -  with meals and at bedtime.       No Known Allergies     Follow-up Information   Follow up with Rudi Heap, MD. Schedule an appointment as soon as possible for a visit in 1 week. (for evaluation of symptoms)    Specialty:  Family Medicine   Contact information:   [address] [place] [address] [phone]       Follow up with Malissa Hippo, MD. Schedule an appointment as soon as possible for a visit on 04/01/2013. (appointment at 3:15pm)    Specialty:  Gastroenterology   Contact information:   [address], [address] [place] [address] [phone]        The results of significant diagnostics from this hospitalization (including imaging, microbiology, ancillary and  laboratory) are listed below for reference.    Significant Diagnostic Studies: Dg Chest 2 View  03/12/2013   CLINICAL DATA:  Weakness and abdominal pain.  Smoker.  EXAM: CHEST  2 VIEW  COMPARISON:  None.  FINDINGS: Normal-sized heart. Clear lungs. Diffuse peribronchial thickening and prominence of the interstitial markings. Right diaphragmatic eventrations. Mild thoracic spine degenerative changes.  IMPRESSION: No acute abnormality.  Changes of chronic bronchitis.   Electronically Signed   By: Gordan Payment M.D.   On: 03/12/2013 19:47   Ct Abdomen Pelvis W Contrast  03/12/2013   CLINICAL DATA:  Left lower quadrant abdominal pain. Black stools. Previous splenectomy.  EXAM: CT ABDOMEN AND PELVIS WITH CONTRAST  TECHNIQUE: Multidetector CT imaging of the abdomen and pelvis was performed using the standard protocol following bolus administration of intravenous contrast.  CONTRAST:  50mL OMNIPAQUE IOHEXOL 300 MG/ML SOLN, OMNIPAQUE IOHEXOL 300 MG/ML SOLN  COMPARISON:  None.  FINDINGS: Lobulated liver contours with a small right lobe and enlarged lateral  segment left lobe and caudate lobe. Upper abdominal varices. Moderate amount of free peritoneal fluid.  Multiple gallstones in the gallbladder measuring up to 1.6 cm in diameter each. No gallbladder wall thickening or pericholecystic fluid.  Small amount of atelectasis at both lung bases, greater on the right. Surgically absent spleen. Unremarkable pancreas, adrenal glands, kidneys and urinary bladder. Mildly enlarged prostate gland containing coarse calcifications.  No gastrointestinal abnormalities or enlarged lymph nodes. Atheromatous arterial calcifications. Lumbar and lower thoracic spine degenerative changes, including facet degenerative changes with associated grade 1 anterolisthesis at the L4-5 level and grade 1 retrolisthesis at the L1-2 and L2-3 levels.  IMPRESSION: 1. Changes of cirrhosis of the liver with upper abdominal varices and moderate amount  of ascites. 2. Status post splenectomy. 3. Cholelithiasis.   Electronically Signed   By: Gordan Payment M.D.   On: 03/12/2013 20:28   US Venous Img Lower Bilateral  03/13/2013   CLINICAL DATA:  Leg swelling  EXAM: VENOUS DOPPLER ULTRASOUND OF BILATERAL LOWER EXTREMITIES  TECHNIQUE: Gray-scale sonography with graded compression, as well as color Doppler and duplex ultrasound, were performed to evaluate the deep venous system from the level of the common femoral vein through the popliteal and proximal calf veins. Spectral Doppler was utilized to evaluate flow at rest and with distal augmentation maneuvers.  COMPARISON:  CT scan abdomen/ pelvis 03/12/2013  FINDINGS: Thrombus within deep veins:  None visualized.  Compressibility of deep veins:  Normal.  Duplex waveform respiratory phasicity:  Normal.  Duplex waveform response to augmentation:  Normal.  Venous reflux:  None visualized.  Other findings: Bilateral great saphenous veins are patent and compressible. There is edema in the superficial subcutaneous soft tissues of the left calf.  IMPRESSION: No evidence of the DVT in either lower extremity.  Signed,  Sterling Big, MD  [facility]  [facility]   Electronically Signed   By: Malachy Moan M.D.   On: 03/13/2013 14:13    Microbiology: No results found for this or any previous visit (from the past 240 hour(s)).   Labs: Basic Metabolic Panel:  Recent Labs Lab 03/12/13 1740 03/13/13 03/13/13 0548 03/14/13 0523 03/15/13 0514  NA 132*  --  132* 133* 132*  K 3.4*  --  3.7 3.7 3.6  CL 99  --  100 101 99  CO2 25  --  26 27 27   GLUCOSE 120*  --  86 77 100*  BUN 11  --  9 9 9   CREATININE 0.73  --  0.71 0.71 0.77  CALCIUM 8.3*  --  7.5* 7.6* 7.4*  MG  --  1.9  --   --   --    Liver Function Tests:  Recent Labs Lab 03/12/13 1740 03/13/13 0548 03/14/13 0523 03/15/13 0514  AST 52* 40* 45* 51*  ALT 23 17 17 17   ALKPHOS 105 79 88 129*   BILITOT 1.0 1.0 0.8 0.6  PROT 7.0 5.5* 5.6* 5.5*  ALBUMIN 1.8* 1.5* 1.5* 1.5*    Recent Labs Lab 03/12/13 1740  LIPASE 34   No results found for this basename: AMMONIA,  in the last 168 hours CBC:  Recent Labs Lab 03/12/13 1740 03/13/13 0548 03/13/13 2304 03/14/13 0523 03/15/13 0514  WBC 7.2 6.8  --  6.0 5.7  NEUTROABS 4.0  --   --   --   --   HGB 12.9* 10.5* 10.8* 10.8* 10.6*  HCT 36.9* 29.9* 31.1* 31.1* 30.6*  MCV 100.0 100.0  --  100.6* 101.0*  PLT 185 159  --  147* 154   Cardiac Enzymes:  Recent Labs Lab 03/12/13 1740 03/13/13 0548  TROPONINI <0.30 <0.30   BNP: BNP (last 3 results) No results found for this basename: PROBNP,  in the last 8760 hours CBG: No results found for this basename: GLUCAP,  in the last 168 hours     Signed:  Gwenyth Bender  [facility] 03/15/2013, 11:20 AM

## 2013-03-15 NOTE — Progress Notes (Signed)
Subjective; Patient has no complaints. He has very good appetite. He denies melena or rectal bleeding.  Objective; BP 95/53  Pulse 83  Temp(Src) 98.2 F (36.8 C) (Oral)  Resp 17  Ht 5\' 8"  (1.727 m)  Wt 176 lb (79.833 kg)  BMI 26.77 kg/m2  SpO2 94% Patient is alert and does not appear to be anxious. He does not have tremors or asterixis. Abdomen is full but soft and nontender without organomegaly or masses He has trace edema around ankles.  Lab data; H&H is 10.6 and 30.6 Platelet count 154K Serum albumin 1.5 HCVRNA pending.  Assessment; #1. Upper GI bleed secondary to large gastric ulcer which appears to be chronic. No evidence of recurrent bleeds. He is tolerating diet. #2. Decompensated liver disease secondary to excessive alcohol intake and possible HCV infection(HCVRNA pending). He has ascites and will be on low dose diuretic therapy with Ms.Karen Black, PA #3. H. pylori gastritis and treated on an outpatient basis.   Recommendations; Agree with discharge planning. Consider pneumococcal and flu vaccine prior to discharge.  Patient has been repeatedly educated about not taking NSAIDs also must not drink alcohol. Patient will be seen in the office in 2 weeks. He will have CBC and metabolic 7 and albumin prior to his office visit. Treatment for H. pylori infection possibly will be initiated on that visit. Alpha-fetoprotein if lab has serum saved otherwise will do it at the time of office visit.

## 2013-03-17 LAB — AFP TUMOR MARKER: AFP-Tumor Marker: 17.7 ng/mL — ABNORMAL HIGH (ref 0.0–8.0)

## 2013-03-18 LAB — HEPATITIS C VRS RNA DETECT BY PCR-QUAL: Hepatitis C Vrs RNA by PCR-Qual: POSITIVE — AB

## 2013-03-20 ENCOUNTER — Ambulatory Visit (INDEPENDENT_AMBULATORY_CARE_PROVIDER_SITE_OTHER): Payer: Medicare Other | Admitting: Family Medicine

## 2013-03-20 ENCOUNTER — Encounter (INDEPENDENT_AMBULATORY_CARE_PROVIDER_SITE_OTHER): Payer: Self-pay | Admitting: *Deleted

## 2013-03-20 DIAGNOSIS — K922 Gastrointestinal hemorrhage, unspecified: Secondary | ICD-10-CM

## 2013-03-20 LAB — POCT HEMOGLOBIN: Hemoglobin: 12.1 g/dL — AB (ref 14.1–18.1)

## 2013-03-21 LAB — CMP14+EGFR
Albumin: 2.1 g/dL — ABNORMAL LOW (ref 3.6–4.8)
Alkaline Phosphatase: 188 IU/L — ABNORMAL HIGH (ref 39–117)
BUN/Creatinine Ratio: 14 (ref 10–22)
BUN: 9 mg/dL (ref 8–27)
CO2: 22 mmol/L (ref 18–29)
Calcium: 8.4 mg/dL — ABNORMAL LOW (ref 8.6–10.2)
Chloride: 104 mmol/L (ref 97–108)
Creatinine, Ser: 0.64 mg/dL — ABNORMAL LOW (ref 0.76–1.27)
Globulin, Total: 4.2 g/dL (ref 1.5–4.5)
Glucose: 102 mg/dL — ABNORMAL HIGH (ref 65–99)
Total Protein: 6.3 g/dL (ref 6.0–8.5)

## 2013-03-21 LAB — ANEMIA PROFILE B
Basos: 2 %
Eos: 5 %
Hemoglobin: 12 g/dL — ABNORMAL LOW (ref 12.6–17.7)
Immature Grans (Abs): 0 10*3/uL (ref 0.0–0.1)
Iron Saturation: 33 % (ref 15–55)
Lymphs: 46 %
MCH: 34.3 pg — ABNORMAL HIGH (ref 26.6–33.0)
MCV: 99 fL — ABNORMAL HIGH (ref 79–97)
Monocytes Absolute: 0.8 10*3/uL (ref 0.1–0.9)
Monocytes: 15 %
Neutrophils Relative %: 32 %
RBC: 3.5 x10E6/uL — ABNORMAL LOW (ref 4.14–5.80)
RDW: 14.1 % (ref 12.3–15.4)
Retic Ct Pct: 1.6 % (ref 0.6–2.6)
TIBC: 218 ug/dL — ABNORMAL LOW (ref 250–450)
UIBC: 146 ug/dL — ABNORMAL LOW (ref 150–375)
Vitamin B-12: 1097 pg/mL — ABNORMAL HIGH (ref 211–946)
WBC: 5.2 10*3/uL (ref 3.4–10.8)

## 2013-03-22 ENCOUNTER — Emergency Department (HOSPITAL_COMMUNITY)
Admission: EM | Admit: 2013-03-22 | Discharge: 2013-03-23 | Disposition: A | Discharge status: Home / Self Care | Payer: Medicare Other | Attending: Emergency Medicine | Admitting: Emergency Medicine

## 2013-03-22 ENCOUNTER — Other Ambulatory Visit: Payer: Medicare Other

## 2013-03-22 ENCOUNTER — Encounter (HOSPITAL_COMMUNITY): Payer: Self-pay | Admitting: Emergency Medicine

## 2013-03-22 ENCOUNTER — Emergency Department (HOSPITAL_COMMUNITY): Payer: Medicare Other

## 2013-03-22 DIAGNOSIS — Z79899 Other long term (current) drug therapy: Secondary | ICD-10-CM | POA: Insufficient documentation

## 2013-03-22 DIAGNOSIS — Z9181 History of falling: Secondary | ICD-10-CM | POA: Insufficient documentation

## 2013-03-22 DIAGNOSIS — Z8619 Personal history of other infectious and parasitic diseases: Secondary | ICD-10-CM | POA: Insufficient documentation

## 2013-03-22 DIAGNOSIS — F17200 Nicotine dependence, unspecified, uncomplicated: Secondary | ICD-10-CM | POA: Insufficient documentation

## 2013-03-22 DIAGNOSIS — Z8639 Personal history of other endocrine, nutritional and metabolic disease: Secondary | ICD-10-CM | POA: Insufficient documentation

## 2013-03-22 DIAGNOSIS — Z8711 Personal history of peptic ulcer disease: Secondary | ICD-10-CM | POA: Insufficient documentation

## 2013-03-22 DIAGNOSIS — K2970 Gastritis, unspecified, without bleeding: Secondary | ICD-10-CM

## 2013-03-22 DIAGNOSIS — Z9089 Acquired absence of other organs: Secondary | ICD-10-CM | POA: Insufficient documentation

## 2013-03-22 DIAGNOSIS — K7460 Unspecified cirrhosis of liver: Secondary | ICD-10-CM

## 2013-03-22 DIAGNOSIS — K7030 Alcoholic cirrhosis of liver without ascites: Secondary | ICD-10-CM | POA: Insufficient documentation

## 2013-03-22 DIAGNOSIS — K208 Other esophagitis without bleeding: Secondary | ICD-10-CM | POA: Insufficient documentation

## 2013-03-22 DIAGNOSIS — R34 Anuria and oliguria: Secondary | ICD-10-CM | POA: Insufficient documentation

## 2013-03-22 DIAGNOSIS — Z862 Personal history of diseases of the blood and blood-forming organs and certain disorders involving the immune mechanism: Secondary | ICD-10-CM | POA: Insufficient documentation

## 2013-03-22 DIAGNOSIS — I10 Essential (primary) hypertension: Secondary | ICD-10-CM | POA: Insufficient documentation

## 2013-03-22 DIAGNOSIS — K8020 Calculus of gallbladder without cholecystitis without obstruction: Secondary | ICD-10-CM | POA: Insufficient documentation

## 2013-03-22 DIAGNOSIS — R5381 Other malaise: Secondary | ICD-10-CM | POA: Insufficient documentation

## 2013-03-22 MED ORDER — PANTOPRAZOLE SODIUM 40 MG IV SOLR
40.0000 mg | Freq: Once | INTRAVENOUS | Status: AC
  Administered 2013-03-23: 40 mg via INTRAVENOUS
  Filled 2013-03-22: qty 40

## 2013-03-22 MED ORDER — SODIUM CHLORIDE 0.9 % IV SOLN
Freq: Once | INTRAVENOUS | Status: AC
  Administered 2013-03-23: via INTRAVENOUS

## 2013-03-22 NOTE — ED Notes (Addendum)
Dr. Rhunette Croft in to assess pt

## 2013-03-22 NOTE — Progress Notes (Signed)
  Subjective:    Patient ID: Aaron Gordon, male    DOB: January 15, 1946, 67 y.o.   MRN: [id number]  HPI Pt presents today for hospital follow up  Pt was seen last week with noted intermittent melena in setting of chronic ETOH abuse and hx/po stomach ulcers.  Pt had heme positive stools on DRE. Hgb was fairly stable at the time. Pt was instructed to go to ER for further evaluation of upper GIB.  Clinical note: Epic was down at the time of patient's visit for me to review his inpatient admission. Patient with noted nonbleeding gastric ulcer and erosive esophagitis on EGD, portal hypertension, liver cirrhosis with ascites, positive hep C, alcohol abuse, cirrhosis in review of the discharge summary. Please see discharge summary for full details. At discharge patient was placed on high-dose protonix, Lasix, aldactone, Carafate. Patient needs samples of PPI today. The patient denies any NSAID or alcohol use. Is still smoking  Melanotic stools have resolved. No abdominal pain. Has followup with gastroenterology of 1 December.  Patient Active Problem List   Diagnosis Date Noted  . Erosive esophagitis 03/15/2013  . Gastric ulcer with hemorrhage 03/15/2013  . Portal hypertensive gastropathy 03/15/2013  . Acute blood loss anemia 03/15/2013  . Ascites 03/15/2013  . Hepatitis C antibody test positive 03/15/2013  . Hypoalbuminemia 03/15/2013  . Hyponatremia 03/15/2013  . Tobacco abuse 03/15/2013  . H. pylori infection 03/15/2013  . Diastolic dysfunction 03/15/2013  . Melena 03/12/2013  . Alcohol abuse 03/12/2013  . Liver cirrhosis 03/12/2013  . Pedal edema 03/12/2013    Review of Systems  All other systems reviewed and are negative.       Objective:   Physical Exam  Constitutional: He is oriented to person, place, and time.  Underweight   HENT:  Head: Normocephalic and atraumatic.  Eyes: Conjunctivae are normal. Pupils are equal, round, and reactive to light.  Neck: Normal range of  motion. Neck supple.  Cardiovascular: Normal rate and regular rhythm.   Pulmonary/Chest: Effort normal and breath sounds normal.  Abdominal: Soft. Bowel sounds are normal. There is tenderness. There is guarding.  Musculoskeletal: Normal range of motion.  Neurological: He is alert and oriented to person, place, and time.  Skin: Skin is warm.  There were no vitals filed for this visit.         Assessment & Plan:  Upper GI bleed - Plan: AMMONIA, Anemia Profile B, CMP14+EGFR, POCT hemoglobin  Clinically normalizing at this point. Euvolemic on exam without abdominal pain, fever.  Stressed importance of alcohol as well as NSAID avoidance. We'll recheck baseline labs including hemoglobin, LFTs, albumin. Check ammonia level. Has followup with gastroenterology pending in less than 2 weeks. Hep C serologies and titers pending. Discussed infectious and gastrointestinal red flags that would warrant reevaluation Plan for followup in one week Call with any questions.

## 2013-03-22 NOTE — ED Notes (Signed)
Pt resting on stretcher with eyes closed, awaiting provider evaluation

## 2013-03-22 NOTE — ED Notes (Signed)
Pt was discharged from inpatient admission 1 week ago for bleeding PUD. Did not get protonix or carafate filled, did fill lasix and aldactone.

## 2013-03-22 NOTE — ED Notes (Addendum)
Pt c/o bloating, chest pain, weakness, and decreased urine output x 1-2 days. Pt was admitted on Nov. 11th.Marland Kitchen

## 2013-03-23 ENCOUNTER — Emergency Department (HOSPITAL_COMMUNITY): Payer: Medicare Other

## 2013-03-23 LAB — LIPASE, BLOOD: Lipase: 116 U/L — ABNORMAL HIGH (ref 11–59)

## 2013-03-23 LAB — TROPONIN I: Troponin I: <0.3 ng/mL (ref <0.30)

## 2013-03-23 LAB — CBC WITH DIFFERENTIAL/PLATELET
Blasts: 0 %
Eosinophils Absolute: 0.2 10*3/uL (ref 0.0–0.7)
Eosinophils Relative: 4 % (ref 0–5)
HCT: 34.5 % — ABNORMAL LOW (ref 39.0–52.0)
Lymphocytes Relative: 53 % — ABNORMAL HIGH (ref 12–46)
Lymphs Abs: 3.3 10*3/uL (ref 0.7–4.0)
MCV: 101.8 fL — ABNORMAL HIGH (ref 78.0–100.0)
Metamyelocytes Relative: 0 %
Monocytes Absolute: 0.6 10*3/uL (ref 0.1–1.0)
Monocytes Relative: 9 % (ref 3–12)
Myelocytes: 0 %
Platelets: 181 10*3/uL (ref 150–400)
RBC: 3.39 MIL/uL — ABNORMAL LOW (ref 4.22–5.81)
RDW: 14.6 % (ref 11.5–15.5)
WBC: 6.2 10*3/uL (ref 4.0–10.5)
nRBC: 0 /100 WBC

## 2013-03-23 LAB — URINALYSIS, ROUTINE W REFLEX MICROSCOPIC
Bilirubin Urine: NEGATIVE
Glucose, UA: NEGATIVE mg/dL
Leukocytes, UA: NEGATIVE
Nitrite: NEGATIVE
Specific Gravity, Urine: >1.03 — ABNORMAL HIGH (ref 1.005–1.030)
Urobilinogen, UA: 0.2 mg/dL (ref 0.0–1.0)
pH: 6 (ref 5.0–8.0)

## 2013-03-23 LAB — COMPREHENSIVE METABOLIC PANEL
AST: 70 U/L — ABNORMAL HIGH (ref 0–37)
Albumin: 1.7 g/dL — ABNORMAL LOW (ref 3.5–5.2)
Alkaline Phosphatase: 224 U/L — ABNORMAL HIGH (ref 39–117)
Chloride: 103 mEq/L (ref 96–112)
GFR calc Af Amer: >90 mL/min (ref >90)
Glucose, Bld: 96 mg/dL (ref 70–99)
Potassium: 3.7 mEq/L (ref 3.5–5.1)
Total Bilirubin: 0.6 mg/dL (ref 0.3–1.2)
Total Protein: 6.6 g/dL (ref 6.0–8.3)

## 2013-03-23 LAB — URINE MICROSCOPIC-ADD ON

## 2013-03-23 LAB — PROTIME-INR: INR: 1.16 (ref 0.00–1.49)

## 2013-03-23 LAB — APTT: aPTT: 33 seconds (ref 24–37)

## 2013-03-23 MED ORDER — RANITIDINE HCL 150 MG PO TABS: 150.0000 mg | ORAL_TABLET | Freq: Two times a day (BID) | ORAL | Status: DC

## 2013-03-23 NOTE — ED Notes (Signed)
Pt continues to drink PO contrast

## 2013-03-23 NOTE — ED Notes (Signed)
MD in to reassess pt

## 2013-03-23 NOTE — ED Provider Notes (Addendum)
CSN: [id number]     Arrival date & time 03/22/13  2132 History   First MD Initiated Contact with Patient 03/22/13 2305     Chief Complaint  Patient presents with  . Bloated  . decreased urination   . Weakness   (Consider location/radiation/quality/duration/timing/severity/associated sxs/prior Treatment) HPI Comments: Patient with hx of gastric ulcer and erosive esophagitis on EGD, portal hypertension, liver cirrhosis with ascites, positive hep C, alcohol abuse comes in with cc of abd distention and weakness. Pt just received all his diagnoses from recent admission, awaiting medicare clearance for his GI meds. Pt was discharged on Friday, and started having feeling worse on Monday. Pt states that he feels that the abd is distended with some epigastric discomfort. He has no n/v/f/c/diarrhea/bloody stools. Pt has no chest pain, dib. States he hasnt been drinking at all since his admission.  Patient is a 67 y.o. male presenting with weakness. The history is provided by the patient and medical records.  Weakness Associated symptoms include abdominal pain. Pertinent negatives include no chest pain, no headaches and no shortness of breath.    Past Medical History  Diagnosis Date  . Stomach ulcer 1970's  . Anemia   . Erosive esophagitis 03/15/2013  . Gastric ulcer with hemorrhage 03/15/2013  . Portal hypertensive gastropathy 03/15/2013  . 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 Alcohol abuse   . Hepatitis C antibody test positive 03/15/2013  . Hypoalbuminemia 03/15/2013  . Liver cirrhosis 03/12/2013    From ETOH and Hep C  . Tobacco abuse 03/15/2013  . H. pylori infection 03/15/2013  . Diastolic dysfunction 03/15/2013    Grade 1. Ejection fraction 60%.   Past Surgical History  Procedure Laterality Date  . Splenectomy, total  1971  . Esophagogastroduodenoscopy N/A 03/13/2013    Procedure: ESOPHAGOGASTRODUODENOSCOPY (EGD);  Surgeon: Malissa Hippo, MD;  Location: [facility];  Service: Endoscopy;  Laterality: N/A;   Family History  Problem Relation Age of Onset  . Cancer Mother     intestinal   History  Substance Use Topics  . Smoking status: Current Every Day Smoker -- 0.50 packs/day for 47 years    Types: Cigarettes  . Smokeless tobacco: Not on file  . Alcohol Use: Yes     Comment: daily up until 2 weeks ago    Review of Systems  Constitutional: Negative for fever, chills and activity change.  Eyes: Negative for visual disturbance.  Respiratory: Negative for cough, chest tightness and shortness of breath.   Cardiovascular: Negative for chest pain.  Gastrointestinal: Positive for abdominal pain and abdominal distention. Negative for nausea, vomiting, diarrhea and blood in stool.  Genitourinary: Negative for dysuria, enuresis and difficulty urinating.  Musculoskeletal: Negative for arthralgias and neck pain.  Skin: Negative for rash.  Neurological: Positive for weakness. Negative for dizziness, light-headedness and headaches.  Hematological: Does not bruise/bleed easily.  Psychiatric/Behavioral: Negative for confusion.    Allergies  Review of patient's allergies indicates no known allergies.  Home Medications   Current Outpatient Rx  Name  Route  Sig  Dispense  Refill  . esomeprazole (NEXIUM) 40 MG capsule   Oral   Take 40 mg by mouth every morning.         . furosemide (LASIX) 20 MG tablet   Oral   Take 1 tablet (20 mg total) by mouth daily.   30 tablet   1   . spironolactone (ALDACTONE) 50 MG tablet   Oral   Take  1 tablet (50 mg total) by mouth daily.   30 tablet   1   . pantoprazole (PROTONIX) 40 MG tablet   Oral   Take 1 tablet (40 mg total) by mouth 2 (two) times daily.   60 tablet   1   . sucralfate (CARAFATE) 1 GM/10ML suspension   Oral   Take 10 mLs (1 g total) by mouth 4 (four) times daily -  with meals and at bedtime.   420 mL   0    BP 108/72  Pulse 72  Temp(Src) 98.3 F (36.8 C) (Oral)  Resp 16   Wt 176 lb (79.833 kg)  SpO2 98% Physical Exam  Nursing note and vitals reviewed. Constitutional: He is oriented to person, place, and time. He appears well-developed.  HENT:  Head: Normocephalic and atraumatic.  Eyes: Conjunctivae and EOM are normal. Pupils are equal, round, and reactive to light.  Neck: Normal range of motion. Neck supple.  Cardiovascular: Normal rate and regular rhythm.   Pulmonary/Chest: Effort normal and breath sounds normal.  Abdominal: Soft. Bowel sounds are normal. He exhibits distension. There is no tenderness. There is no rebound and no guarding.  Soft, mild distention, no fluid wave, upper quadrant tenderness to palpation  Neurological: He is alert and oriented to person, place, and time.  Skin: Skin is warm.    ED Course  Procedures (including critical care time) Labs Review Labs Reviewed  TROPONIN I  URINALYSIS, ROUTINE W REFLEX MICROSCOPIC  COMPREHENSIVE METABOLIC PANEL  APTT  LIPASE, BLOOD  PROTIME-INR  CBC WITH DIFFERENTIAL   Imaging Review No results found.  EKG Interpretation    Date/Time:  Friday March 22 2013 23:48:04 EST Ventricular Rate:  71 PR Interval:  150 QRS Duration: 92 QT Interval:  452 QTC Calculation: 491 R Axis:   -38 Text Interpretation:  Normal sinus rhythm Left axis deviation Prolonged QT Abnormal ECG When compared with ECG of 12-Mar-2013 17:24, No significant change was found Confirmed by Wrenn Willcox, MD, Charise Leinbach ([id number]) on 03/23/2013 2:11:22 AM            MDM  No diagnosis found.  Pt comes in with cc of abd distention and discomfort. Hx of liver cirrhosis, recent EGD, and ascites. Pt has some weakness, no fevers, chills and no nausea, emesis, diarrhea. Abd exam is + for epigastric and upper quadrant tenderness only, and no peritoneal findings.  DDX: Perforation from recent EGD PUD Pancreatitis Hepatitis SBP  We will get basic labs and reassess the patient.  Again, no peritoneal signs, confusion,  active bleeds.  Derwood Kaplan, MD 03/23/13 4401  Derwood Kaplan, MD 03/23/13 0211  4:47 AM Serial abd exam - 2:11 am, and 4:30 am unchanged. CT is WNL, no pancreatitis.  Has cholelithiasis with elevated liver enz - surgery f/u requested. Giving zantac for protonix substitute until he can get protonix cleared.    Derwood Kaplan, MD 03/23/13 [phone]

## 2013-03-23 NOTE — ED Notes (Signed)
Pt alert & oriented x4, stable gait. Patient given discharge instructions, paperwork & prescription(s). Patient  instructed to stop at the registration desk to finish any additional paperwork. Patient verbalized understanding. Pt left department w/ no further questions. 

## 2013-03-23 NOTE — ED Notes (Signed)
Pt finished contrast, called CT. CT informed me that since he is only having PO contrast that CT can not be done until ~0430

## 2013-03-25 ENCOUNTER — Ambulatory Visit (HOSPITAL_COMMUNITY)
Admission: RE | Admit: 2013-03-25 | Discharge: 2013-03-25 | Disposition: A | Discharge status: Home / Self Care | Payer: Medicare Other | Source: Ambulatory Visit | Attending: Family Medicine | Admitting: Family Medicine

## 2013-03-25 ENCOUNTER — Ambulatory Visit (INDEPENDENT_AMBULATORY_CARE_PROVIDER_SITE_OTHER): Payer: Medicare Other | Admitting: Family Medicine

## 2013-03-25 ENCOUNTER — Encounter: Payer: Self-pay | Admitting: Family Medicine

## 2013-03-25 VITALS — BP 126/78 | HR 78 | Temp 98.5°F | Ht 68.0 in | Wt 185.0 lb

## 2013-03-25 DIAGNOSIS — K769 Liver disease, unspecified: Secondary | ICD-10-CM | POA: Insufficient documentation

## 2013-03-25 DIAGNOSIS — M7989 Other specified soft tissue disorders: Secondary | ICD-10-CM | POA: Insufficient documentation

## 2013-03-25 DIAGNOSIS — R609 Edema, unspecified: Secondary | ICD-10-CM

## 2013-03-25 NOTE — Progress Notes (Signed)
  Subjective:    Patient ID: Aaron Gordon, male    DOB: [birth year], 67 y.o.   MRN: [id number]  HPI  Patient presents today for emergency room followup. The patient was directed to the ER last week in the setting of abdominal pain with history of portal hypertension, ascites, gastriculcers, melanoticstools.A CT scan was obtained last week that was indicative of gallbladder disease. Patient has followup with surgery pending. Abdominal pain has resolved.  Patient also reports he's had progressive lower extremity swelling since emergency room discharge. The patient was originally hospitalized on November 11 of November 14 with workup including endoscopy proven gastric ulcers. Please see discharge summary for full details. The patient denies any NSAID or aspirin use. Has been compliant with his spironolactone as well as Lasix. Denies any excess salt intake. Lower extremity swelling is worse on the left versus right. Also has some left popliteal pain. Neurovascularly intact distally.  Patient Active Problem List   Diagnosis Date Noted  . Erosive esophagitis 03/15/2013  . Gastric ulcer with hemorrhage 03/15/2013  . Portal hypertensive gastropathy 03/15/2013  . Acute blood loss anemia 03/15/2013  . Ascites 03/15/2013  . Hepatitis C antibody test positive 03/15/2013  . Hypoalbuminemia 03/15/2013  . Hyponatremia 03/15/2013  . Tobacco abuse 03/15/2013  . H. pylori infection 03/15/2013  . Diastolic dysfunction 03/15/2013  . Melena 03/12/2013  . Alcohol abuse 03/12/2013  . Liver cirrhosis 03/12/2013  . Pedal edema 03/12/2013     Review of Systems  All other systems reviewed and are negative.       Objective:   Physical Exam  Constitutional:  Mildly disshevled appearing    HENT:  Head: Normocephalic and atraumatic.  Right Ear: External ear normal.  Left Ear: External ear normal.  Eyes: Conjunctivae are normal. Pupils are equal, round, and reactive to light.  Neck: Normal range of  motion. Neck supple.  Cardiovascular: Normal rate and regular rhythm.   Pulmonary/Chest: Effort normal and breath sounds normal.  Abdominal:  + abd distension Mild RUQ TTP-improved from ER visit.  + bowel sounds    Musculoskeletal: Normal range of motion.  + 2-3 + pitting edema in lower extremities bilaterally L>R + L sided popliteal tenderness   Neurological: He is alert.          Assessment & Plan:  Edema - Plan: US Venous Img Lower Bilateral, CMP14+EGFR  Liver disease - Plan: US Venous Img Lower Bilateral, CMP14+EGFR  Differential diagnosis for current symptoms includes DVT versus progressive ascites. We'll check lower extremity ultrasound to rule out thromboembolic disease. Patient at much higher bleeding risk if clot is present given recent visit for melanotic stools. If negative, will double dose of Lasix to help with fluid. Plan for recheck in 2 days. Weight is noted to be up about 3 kg since November 21. Also check renal function and albumin today. Discuss gastrointestinal as well as vascular class at length. Avoid NSAIDs. Low salt diet. Continue Aldactone and Lasix.

## 2013-03-26 LAB — CMP14+EGFR
ALT: 29 IU/L (ref 0–44)
AST: 79 IU/L — ABNORMAL HIGH (ref 0–40)
Albumin/Globulin Ratio: 0.5 — ABNORMAL LOW (ref 1.1–2.5)
Albumin: 2.1 g/dL — ABNORMAL LOW (ref 3.6–4.8)
Alkaline Phosphatase: 188 IU/L — ABNORMAL HIGH (ref 39–117)
BUN: 8 mg/dL (ref 8–27)
Calcium: 7.9 mg/dL — ABNORMAL LOW (ref 8.6–10.2)
Creatinine, Ser: 0.7 mg/dL — ABNORMAL LOW (ref 0.76–1.27)
GFR calc Af Amer: 114 mL/min/{1.73_m2} (ref >59)
GFR calc non Af Amer: 98 mL/min/{1.73_m2} (ref >59)
Globulin, Total: 4 g/dL (ref 1.5–4.5)
Glucose: 71 mg/dL (ref 65–99)
Sodium: 139 mmol/L (ref 134–144)
Total Bilirubin: 0.8 mg/dL (ref 0.0–1.2)
Total Protein: 6.1 g/dL (ref 6.0–8.5)

## 2013-04-01 ENCOUNTER — Encounter (INDEPENDENT_AMBULATORY_CARE_PROVIDER_SITE_OTHER): Payer: Self-pay | Admitting: Internal Medicine

## 2013-04-01 ENCOUNTER — Ambulatory Visit (INDEPENDENT_AMBULATORY_CARE_PROVIDER_SITE_OTHER): Payer: Medicare Other | Admitting: Internal Medicine

## 2013-04-01 VITALS — BP 122/70 | HR 76 | Temp 98.8°F | Ht 68.0 in | Wt 182.2 lb

## 2013-04-01 DIAGNOSIS — K7030 Alcoholic cirrhosis of liver without ascites: Secondary | ICD-10-CM

## 2013-04-01 DIAGNOSIS — B1920 Unspecified viral hepatitis C without hepatic coma: Secondary | ICD-10-CM

## 2013-04-01 DIAGNOSIS — A048 Other specified bacterial intestinal infections: Secondary | ICD-10-CM

## 2013-04-01 DIAGNOSIS — K219 Gastro-esophageal reflux disease without esophagitis: Secondary | ICD-10-CM

## 2013-04-01 MED ORDER — OMEPRAZOLE 40 MG PO CPDR: 40.0000 mg | DELAYED_RELEASE_CAPSULE | Freq: Every day | ORAL | Status: DC

## 2013-04-01 MED ORDER — BIS SUBCIT-METRONID-TETRACYC 140-125-125 MG PO CAPS: 3.0000 | ORAL_CAPSULE | Freq: Three times a day (TID) | ORAL | Status: DC

## 2013-04-01 NOTE — Patient Instructions (Signed)
Go to drug store and pick up medicine.  AFP and Hep C quaint today.  OV in 1 month with 4 weeks with Dr. Maren Reamer are going to get a call from [facility]. You are going to receive Albumin to help with the edema.

## 2013-04-01 NOTE — Progress Notes (Addendum)
Subjective:     Patient ID: Aaron Gordon, male   DOB: October 22, 1945, 67 y.o.   MRN: [id number]  HPI Here today for f/u after recent admission to [facility] for melena. 03/12/2013 WUJ:[id number]: Patient is 67 year old male who presents with melena and anemia of he has history of gastric ulcer which was diagnosed in May 2011 at [facility]. He did not return for f/u.  Findings  Mild portal gastropathy but no evidence of esophageal or gastric varices.  Very large ulcer at gastric antrum doubt active bleed felt to be the source of patient's melena.  Patient will need to be treated for H. pylori infection and will undergo repeat EGD in 6-8 weeks. unndergoing EGD with therapeutic intention.  He has been taking Nexium daily. He is not taking the  carafate or the Protonix due to his insurance. He tells me he is doing okay. He is not drinking or smoking. He is not taking any NSAIDs.   Appetite is okay.  He is eating 2 meals a day.  His stools are yellow in color.He denies melena or bright red rectal bleeding. No vomiting.  No abdominal pain. He does have edema to his lower extremities. No scrotal edema.  Egg sandwich in am and oatmeal.   Patient has appt with Dr Lovell Sheehan concerning his gallbladder 04/09/2013   New diagnosis of Hep C. 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   03/23/2013 CT abdomen. No change from 03/12/2013 to suggest acute intra-abdominal  disease.  2. Cirrhosis with portal hypertension causing small ascites.  3. Cholelithiasis. 03/13/2013 AFP 17.7   03/25/2013 Venous doppler study; Lower extremity edema; IMPRESSION:  1. Subcutaneous edema in the lower legs.  2. No DVT observed.   CBC    Component Value Date/Time   WBC 6.2 03/22/2013 2355   WBC 5.2 03/20/2013 1711   WBC 7.8 03/12/2013 1312   RBC 3.39* 03/22/2013 2355   RBC 3.50* 03/20/2013 1711   RBC 4.0* 03/12/2013 1312   HGB 11.9* 03/22/2013 2355   HGB 12.1* 03/20/2013 1711   HCT 34.5* 03/22/2013 2355   HCT 41.6* 03/12/2013  1312   PLT 181 03/22/2013 2355   MCV 101.8* 03/22/2013 2355   MCV 103.2* 03/12/2013 1312   MCH 35.1* 03/22/2013 2355   MCH 34.3* 03/20/2013 1711   MCH 33.5* 03/12/2013 1312   MCHC 34.5 03/22/2013 2355   MCHC 34.5 03/20/2013 1711   MCHC 32.5 03/12/2013 1312   RDW 14.6 03/22/2013 2355   RDW 14.1 03/20/2013 1711   LYMPHSABS 3.3 03/22/2013 2355   LYMPHSABS 2.4 03/20/2013 1711   MONOABS 0.6 03/22/2013 2355   EOSABS 0.2 03/22/2013 2355   EOSABS 0.3 03/20/2013 1711   BASOSABS 0.1 03/22/2013 2355   BASOSABS 0.1 03/20/2013 1711   03/25/2013 Albumin 2.1  CMP     Component Value Date/Time   NA 139 03/25/2013 1110   NA 135 03/22/2013 2355   K 4.0 03/25/2013 1110   CL 103 03/25/2013 1110   CO2 21 03/25/2013 1110   GLUCOSE 71 03/25/2013 1110   GLUCOSE 96 03/22/2013 2355   BUN 8 03/25/2013 1110   BUN 9 03/22/2013 2355   CREATININE 0.70* 03/25/2013 1110   CALCIUM 7.9* 03/25/2013 1110   PROT 6.1 03/25/2013 1110   PROT 6.6 03/22/2013 2355   ALBUMIN 1.7* 03/22/2013 2355   AST 79* 03/25/2013 1110   ALT 29 03/25/2013 1110   ALKPHOS 188* 03/25/2013 1110   BILITOT 0.8 03/25/2013 1110   GFRNONAA  98 03/25/2013 1110   GFRAA 114 03/25/2013 1110      Review of Systems see hpi Current Outpatient Prescriptions  Medication Sig Dispense Refill  . esomeprazole (NEXIUM) 40 MG capsule Take 40 mg by mouth every morning.      . furosemide (LASIX) 20 MG tablet Take 40 mg by mouth daily.      Marland Kitchen spironolactone (ALDACTONE) 50 MG tablet Take 1 tablet (50 mg total) by mouth daily.  30 tablet  1  . bismuth-metronidazole-tetracycline (PYLERA) 140-125-125 MG per capsule Take 3 capsules by mouth 4 (four) times daily -  before meals and at bedtime.  120 capsule  0  . omeprazole (PRILOSEC) 40 MG capsule Take 1 capsule (40 mg total) by mouth daily.  60 capsule  3  . pantoprazole (PROTONIX) 40 MG tablet Take 1 tablet (40 mg total) by mouth 2 (two) times daily.  60 tablet  1  . ranitidine (ZANTAC) 150 MG  tablet Take 1 tablet (150 mg total) by mouth 2 (two) times daily.  60 tablet  0  . sucralfate (CARAFATE) 1 GM/10ML suspension Take 10 mLs (1 g total) by mouth 4 (four) times daily -  with meals and at bedtime.  420 mL  0   No current facility-administered medications for this visit.   Past Medical History  Diagnosis Date  . Stomach ulcer 1970's  . Anemia   . Erosive esophagitis 03/15/2013  . Gastric ulcer with hemorrhage 03/15/2013  . Portal hypertensive gastropathy 03/15/2013  . 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 Alcohol abuse   . Hepatitis C antibody test positive 03/15/2013  . Hypoalbuminemia 03/15/2013  . Liver cirrhosis 03/12/2013    From ETOH and Hep C  . Tobacco abuse 03/15/2013  . H. pylori infection 03/15/2013  . Diastolic dysfunction 03/15/2013    Grade 1. Ejection fraction 60%.   Past Surgical History  Procedure Laterality Date  . Splenectomy, total  1971  . Esophagogastroduodenoscopy N/A 03/13/2013    Procedure: ESOPHAGOGASTRODUODENOSCOPY (EGD);  Surgeon: Malissa Hippo, MD;  Location: [facility];  Service: Endoscopy;  Laterality: N/A;   No Known Allergies      Objective:   Physical Exam  Filed Vitals:   04/01/13 1547  BP: 122/70  Pulse: 76  Temp: 98.8 F (37.1 C)  Height: 5\' 8"  (1.727 m)  Weight: 182 lb 3.2 oz (82.645 kg)   Alert and oriented. Skin warm and dry. Oral mucosa is moist.   . Sclera anicteric, conjunctivae is pink. Thyroid not enlarged. No cervical lymphadenopathy. Bilateral wheezes. Heart regular rate and rhythm.  Abdomen is soft. Bowel sounds are positive. No hepatomegaly. No abdominal masses felt. No tenderness.  2-3 + edema to lower extremities. 1+ edema to his thighs. No scrotal edfema.      Assessment:       PUD. Hx of melena. Hemoglobin 11.9. No recent hx of melena. Patient is not drinks. Albumin is slightly low. Has come up to 2.1 from 1.7. He has 2-3+  edema to his lower extremities.   1+ edema to his thighs.  Hep C  positive. Genotype 1B. I discussed this case with Dr. Karilyn Cota.  Plan:     Rx for Omeprazole 40mg  BID, Pylera x 10 day.    recheck cmet in 4 weeks. Will need a repeat EGD around the 1st of January to see if gastric ulcer has healed. Continue the Aldactone and Lasix.  Hep C quant. AFP today Cmet in 4  weeks. OV 4-5 weeks with Dr. Karilyn Cota Once he is stable, will treat his Hepatitis C.     04/02/2013 Please note, patient's insurance would not pay for the pylera. Rx for Clarithromycin and Amoxicillin eprescribed to his pharmacy

## 2013-04-02 ENCOUNTER — Other Ambulatory Visit (INDEPENDENT_AMBULATORY_CARE_PROVIDER_SITE_OTHER): Payer: Self-pay | Admitting: *Deleted

## 2013-04-02 ENCOUNTER — Encounter (INDEPENDENT_AMBULATORY_CARE_PROVIDER_SITE_OTHER): Payer: Self-pay | Admitting: *Deleted

## 2013-04-02 DIAGNOSIS — K259 Gastric ulcer, unspecified as acute or chronic, without hemorrhage or perforation: Secondary | ICD-10-CM

## 2013-04-02 DIAGNOSIS — B1920 Unspecified viral hepatitis C without hepatic coma: Secondary | ICD-10-CM

## 2013-04-02 LAB — HEPATITIS C RNA QUANTITATIVE
HCV Quantitative Log: 5.81 {Log} — ABNORMAL HIGH (ref <1.18)
HCV Quantitative: 651097 IU/mL — ABNORMAL HIGH (ref <15)

## 2013-04-02 LAB — AFP TUMOR MARKER: AFP-Tumor Marker: 14.8 ng/mL — ABNORMAL HIGH (ref 0.0–8.0)

## 2013-04-02 MED ORDER — AMOXICILLIN 500 MG PO CAPS: 500.0000 mg | ORAL_CAPSULE | Freq: Two times a day (BID) | ORAL | Status: DC

## 2013-04-02 MED ORDER — CLARITHROMYCIN 250 MG PO TABS: 500.0000 mg | ORAL_TABLET | Freq: Two times a day (BID) | ORAL | Status: DC

## 2013-04-02 NOTE — Addendum Note (Signed)
Addended by: Len Blalock on: 04/02/2013 08:04 AM   Modules accepted: Orders

## 2013-04-03 ENCOUNTER — Encounter: Payer: Self-pay | Admitting: Family Medicine

## 2013-04-03 ENCOUNTER — Ambulatory Visit (INDEPENDENT_AMBULATORY_CARE_PROVIDER_SITE_OTHER): Payer: Medicare Other | Admitting: Family Medicine

## 2013-04-03 ENCOUNTER — Telehealth: Payer: Self-pay | Admitting: *Deleted

## 2013-04-03 VITALS — BP 115/71 | HR 85 | Temp 99.7°F | Ht 68.0 in | Wt 181.0 lb

## 2013-04-03 DIAGNOSIS — K766 Portal hypertension: Secondary | ICD-10-CM

## 2013-04-03 MED ORDER — FUROSEMIDE 40 MG PO TABS: 40.0000 mg | ORAL_TABLET | Freq: Every day | ORAL | Status: DC

## 2013-04-03 NOTE — Telephone Encounter (Signed)
Lm to call back

## 2013-04-03 NOTE — Telephone Encounter (Signed)
Pt informed labs from todays visit are not back yet

## 2013-04-03 NOTE — Progress Notes (Signed)
   Subjective:    Patient ID: Aaron Gordon, male    DOB: June 25, 1945, 67 y.o.   MRN: [id number]  HPI Patient presents today for chronic problem followup. Patient had a fairly extensive medical history recently including gastric ulcers diagnosed by endoscopy, portal hypertensive gastropathy, liver cirrhosis, melanotic stools in the setting of upper GI bleed as well as being hep C positive. Have followup with gastroenterology yesterday. Was started H. pylori treatment with amoxicillin, Biaxin, Prilosec. Has been compliant with spironolactone and Lasix. Lasix was increased about 2 weeks ago this patient had progressive lower extremity edema. There has been subtle improvement is in this Weight is noted to be down about 4 pounds from the last visit today. Patient denies any abdominal pain, nausea, vomiting, diarrhea, melanotic stools. Patient is avoiding acidic foods, caffeine, NSAIDs.   Review of Systems  All other systems reviewed and are negative.       Objective:   Physical Exam Filed Vitals:   04/03/13 0952  BP: 115/71  Pulse: 85  Temp: 99.7 F (37.6 C)    Constitutional:  Mildly disshevled appearing  HENT:  Head: Normocephalic and atraumatic.  Right Ear: External ear normal.  Left Ear: External ear normal.  Eyes: Conjunctivae are normal. Pupils are equal, round, and reactive to light.  Neck: Normal range of motion. Neck supple.  Cardiovascular: Normal rate and regular rhythm.  Pulmonary/Chest: Effort normal and breath sounds normal.  Abdominal:  Minimal abd pain  Musculoskeletal: Normal range of motion.  1-2 + pitting edema in comparison to previous exam.  Neurological: He is alert.           Assessment & Plan:  Portal hypertension - Plan: furosemide (LASIX) 40 MG tablet  Weight seems to be responding to increased dose of Lasix. We'll continue with this regimen pending gastroenterology followup early next month. Discuss continued avoidance of sugar agents  like alcohol, NSAIDs, smoking. Also discussed gastrointestinal red flags including fever, nausea, bloody stools, abdominal pain is worsening. Check weights daily Follow up in 2-3 weeks.

## 2013-04-11 ENCOUNTER — Encounter (INDEPENDENT_AMBULATORY_CARE_PROVIDER_SITE_OTHER): Payer: Self-pay | Admitting: *Deleted

## 2013-04-11 ENCOUNTER — Other Ambulatory Visit (INDEPENDENT_AMBULATORY_CARE_PROVIDER_SITE_OTHER): Payer: Self-pay | Admitting: *Deleted

## 2013-04-11 DIAGNOSIS — B1920 Unspecified viral hepatitis C without hepatic coma: Secondary | ICD-10-CM

## 2013-04-11 DIAGNOSIS — K7030 Alcoholic cirrhosis of liver without ascites: Secondary | ICD-10-CM

## 2013-04-11 DIAGNOSIS — K219 Gastro-esophageal reflux disease without esophagitis: Secondary | ICD-10-CM

## 2013-04-29 ENCOUNTER — Ambulatory Visit (INDEPENDENT_AMBULATORY_CARE_PROVIDER_SITE_OTHER): Payer: Medicare Other | Admitting: Family Medicine

## 2013-04-29 ENCOUNTER — Encounter: Payer: Self-pay | Admitting: Family Medicine

## 2013-04-29 VITALS — BP 120/72 | HR 97 | Temp 98.9°F | Ht 68.0 in | Wt 192.0 lb

## 2013-04-29 DIAGNOSIS — K7460 Unspecified cirrhosis of liver: Secondary | ICD-10-CM

## 2013-04-29 DIAGNOSIS — K766 Portal hypertension: Secondary | ICD-10-CM

## 2013-04-29 DIAGNOSIS — Z23 Encounter for immunization: Secondary | ICD-10-CM

## 2013-04-29 DIAGNOSIS — R609 Edema, unspecified: Secondary | ICD-10-CM

## 2013-04-29 MED ORDER — FUROSEMIDE 40 MG PO TABS: 60.0000 mg | ORAL_TABLET | Freq: Every day | ORAL | Status: DC

## 2013-04-29 NOTE — Progress Notes (Signed)
Subjective:    Patient ID: Aaron Gordon, male    DOB: 30-Oct-1945, 67 y.o.   MRN: [id number]  HPI Pt presents today for general follow up.   Gastric ulcer: Pending repeat EGD next month to assess for resolution of gastric ulcer  Taking omeprazole BID daily.  Denies any alcohol or NSAID use.  No melena.  No nausea, diarrhea   Hep C: + serologies.  Noted elevated AFP @ 14.8.  Pending further workup by GI after resolution of # 1   h pylori: S/p amox and clarithromycin with high dose PPI.  Has EGD next month.   Edema:  Baseline portal HTN and grade 1 diastolic dysfunction.  On lasix and aldactone in setting of portal hypertension Baseline dry weight around 180 lbs.  LE edema has worsened.  Noted 10 weight since last visit at beginning of December.  Some salty food intake Has been compliant with lasix dosing.    Patient Active Problem List   Diagnosis Date Noted  . Erosive esophagitis 03/15/2013  . Gastric ulcer with hemorrhage 03/15/2013  . Portal hypertensive gastropathy 03/15/2013  . Acute blood loss anemia 03/15/2013  . Ascites 03/15/2013  . Hepatitis C antibody test positive 03/15/2013  . Hypoalbuminemia 03/15/2013  . Hyponatremia 03/15/2013  . Tobacco abuse 03/15/2013  . H. pylori infection 03/15/2013  . Diastolic dysfunction 03/15/2013  . Melena 03/12/2013  . Alcohol abuse 03/12/2013  . Liver cirrhosis 03/12/2013  . Pedal edema 03/12/2013   Current Outpatient Prescriptions on File Prior to Visit  Medication Sig Dispense Refill  . furosemide (LASIX) 40 MG tablet Take 1 tablet (40 mg total) by mouth daily.  30 tablet  3  . omeprazole (PRILOSEC) 40 MG capsule Take 1 capsule (40 mg total) by mouth daily.  60 capsule  3  . spironolactone (ALDACTONE) 50 MG tablet Take 1 tablet (50 mg total) by mouth daily.  30 tablet  1  . sucralfate (CARAFATE) 1 GM/10ML suspension Take 10 mLs (1 g total) by mouth 4 (four) times daily -  with meals and at bedtime.  420 mL  0    No current facility-administered medications on file prior to visit.    Review of Systems  All other systems reviewed and are negative.       Objective:   Physical Exam Filed Vitals:   04/29/13 0920  BP: 120/72  Pulse: 97  Temp: 98.9 F (37.2 C)    Constitutional:  Mildly disshevled appearing  HENT:  Head: Normocephalic and atraumatic.  Right Ear: External ear normal.  Left Ear: External ear normal.  Eyes: Conjunctivae are normal. Pupils are equal, round, and reactive to light.  Neck: Normal range of motion. Neck supple.  Cardiovascular: Normal rate and regular rhythm.  Pulmonary/Chest: Effort normal and breath sounds normal.  Abdominal:  No abd pain.  + bowel sounds  Musculoskeletal: Normal range of motion.  1-2 + pitting edema in comparison to previous exam.  Neurological: He is alert.         Assessment & Plan:  Cirrhosis - Plan: CBC With differential/Platelet, Comprehensive metabolic panel  Need for Tdap vaccination - Plan: Tdap vaccine greater than or equal to 7yo IM  Orders Placed This Encounter  Procedures  . Tdap vaccine greater than or equal to 7yo IM  . CBC With differential/Platelet  . Comprehensive metabolic panel   Clinically stable from a GI standpoint No abd pain, nausea, melena.  Noted worsening edema (up 10 lbs over 3 weks).  Likely contributions of portal HTN and increased Na.  Low Na diet. Handout given  Start TED hose  Also increase lasix 40mg  daily--> 60 mg daily.  Weight recheck at end of week.  Medical follow up in 1 month.  Tdap given.  Check CBC and CMET.

## 2013-04-29 NOTE — Patient Instructions (Addendum)
Tetanus, Diphtheria (Td) Vaccine What You Need to Know WHY GET VACCINATED? Tetanus  and diphtheria are very serious diseases. They are rare in the [place] today, but people who do become infected often have severe complications. Td vaccine is used to protect adolescents and adults from both of these diseases. Both tetanus and diphtheria are infections caused by bacteria. Diphtheria spreads from person to person through coughing or sneezing. Tetanus-causing bacteria enter the body through cuts, scratches, or wounds. TETANUS (Lockjaw) causes painful muscle tightening and stiffness, usually all over the body.  It can lead to tightening of muscles in the head and neck so you can't open your mouth, swallow, or sometimes even breathe. Tetanus kills about 1 out of every 5 people who are infected. DIPHTHERIA can cause a thick coating to form in the back of the throat.  It can lead to breathing problems, paralysis, heart failure, and death. Before vaccines, the [place] saw as many as 200,000 cases a year of diphtheria and hundreds of cases of tetanus. Since vaccination began, cases of both diseases have dropped by about 99%. TD VACCINE Td vaccine can protect adolescents and adults from tetanus and diphtheria. Td is usually given as a booster dose every 10 years but it can also be given earlier after a severe and dirty wound or burn. Your doctor can give you more information. Td may safely be given at the same time as other vaccines. SOME PEOPLE SHOULD NOT GET THIS VACCINE  If you ever had a life-threatening allergic reaction after a dose of any tetanus or diphtheria containing vaccine, OR if you have a severe allergy to any part of this vaccine, you should not get Td. Tell your doctor if you have any severe allergies.  Talk to your doctor if you:  have epilepsy or another nervous system problem,  had severe pain or swelling after any vaccine containing diphtheria or tetanus,  ever had  Guillain Barr Syndrome (GBS),  aren't feeling well on the day the shot is scheduled. RISKS OF A VACCINE REACTION With a vaccine, like any medicine, there is a chance of side effects. These are usually mild and go away on their own. Serious side effects are also possible, but are very rare. Most people who get Td vaccine do not have any problems with it. Mild Problems  following Td (Did not interfere with activities)  Pain where the shot was given (about 8 people in 10)  Redness or swelling where the shot was given (about 1 person in 3)  Mild fever (about 1 person in 15)  Headache or Tiredness (uncommon) Moderate Problems following Td (Interfered with activities, but did not require medical attention)  Fever over 102 F (38.9 C) (rare) Severe Problems  following Td (Unable to perform usual activities; required medical attention)  Swelling, severe pain, bleeding, or redness in the arm where the shot was given (rare). Problems that could happen after any vaccine:  Brief fainting spells can happen after any medical procedure, including vaccination. Sitting or lying down for about 15 minutes can help prevent fainting, and injuries caused by a fall. Tell your doctor if you feel dizzy, or have vision changes or ringing in the ears.  Severe shoulder pain and reduced range of motion in the arm where a shot was given can happen, very rarely, after a vaccination.  Severe allergic reactions from a vaccine are very rare, estimated at less than 1 in a million doses. If one were to occur, it would   usually be within a few minutes to a few hours after the vaccination. WHAT IF THERE IS A SERIOUS REACTION? What should I look for?  Look for anything that concerns you, such as signs of a severe allergic reaction, very high fever, or behavior changes. Signs of a severe allergic reaction can include hives, swelling of the face and throat, difficulty breathing, a fast heartbeat, dizziness, and  weakness. These would usually start a few minutes to a few hours after the vaccination. What should I do?  If you think it is a severe allergic reaction or other emergency that can't wait, call 911 or get the person to the nearest hospital. Otherwise, call your doctor.  Afterward, the reaction should be reported to the Vaccine Adverse Event Reporting System (VAERS). Your doctor might file this report, or, you can do it yourself through the VAERS website or by calling [phone] [phone]. VAERS is only for reporting reactions. They do not give medical advice. THE NATIONAL VACCINE INJURY COMPENSATION PROGRAM The National Vaccine Injury Compensation Program (VICP) is a federal program that was created to compensate people who may have been injured by certain vaccines. Persons who believe they may have been injured by a vaccine can learn about the program and about filing a claim by calling 1-662 050 6104 or visiting the [facility] website. HOW CAN I LEARN MORE?  Ask your doctor.  Contact your local or state health department.  Contact the Centers for Disease Control and Prevention (CDC):  Call 505-563-5028 (1-800-CDC-INFO)  Visit CDC's vaccines website CDC Td Vaccine Interim VIS (06/05/12) Document Released: 02/13/2006 Document Revised: 08/13/2012 Document Reviewed: 08/08/2012 [facility] Patient Information 2014 [place], [place].  Sodium-Controlled Diet Sodium is a mineral. It is found in many foods. Sodium may be found naturally or added during the making of a food. The most common form of sodium is salt, which is made up of sodium and chloride. Reducing your sodium intake involves changing your eating habits. The following guidelines will help you reduce the sodium in your diet:  Stop using the salt shaker.  Use salt sparingly in cooking and baking.  Substitute with sodium-free seasonings and spices.  Do not use a salt substitute (potassium chloride) without your caregiver's  permission.  Include a variety of fresh, unprocessed foods in your diet.  Limit the use of processed and convenience foods that are high in sodium. USE THE FOLLOWING FOODS SPARINGLY: Breads/Starches  Commercial bread stuffing, commercial pancake or waffle mixes, coating mixes. Waffles. Croutons. Prepared (boxed or frozen) potato, rice, or noodle mixes that contain salt or sodium. Salted [place] fries or hash browns. Salted popcorn, breads, crackers, chips, or snack foods. Vegetables  Vegetables canned with salt or prepared in cream, butter, or cheese sauces. Sauerkraut. Tomato or vegetable juices canned with salt.  Fresh vegetables are allowed if rinsed thoroughly. Fruit  Fruit is okay to eat. Meat and Meat Substitutes  Salted or smoked meats, such as bacon or Canadian bacon, chipped or corned beef, hot dogs, salt pork, luncheon meats, pastrami, ham, or sausage. Canned or smoked fish, poultry, or meat. Processed cheese or cheese spreads, blue or Roquefort cheese. Battered or frozen fish products. Prepared spaghetti sauce. Baked beans. Reuben sandwiches. Salted nuts. Caviar. Milk  Limit buttermilk to 1 cup per week. Soups and Combination Foods  Bouillon cubes, canned or dried soups, broth, consomm. Convenience (frozen or packaged) dinners with more than 600 mg sodium. Pot pies, pizza, Asian food, fast food cheeseburgers, and specialty sandwiches. Desserts and Sweets  Regular (salted) desserts,  pie, commercial fruit snack pies, commercial snack cakes, canned puddings.  Eat desserts and sweets in moderation. Fats and Oils  Gravy mixes or canned gravy. No more than 1 to 2 tbs of salad dressing. Chip dips.  Eat fats and oils in moderation. Beverages  See those listed under the vegetables and milk groups. Condiments  Ketchup, mustard, meat sauces, salsa, regular (salted) and lite soy sauce or mustard. Dill pickles, olives, meat tenderizer. Prepared horseradish or pickle relish.  Dutch-processed cocoa. Baking powder or baking soda used medicinally. Worcestershire sauce. "Light" salt. Salt substitute, unless approved by your caregiver. Document Released: 10/08/2001 Document Revised: 07/11/2011 Document Reviewed: 05/11/2009 [facility] Patient Information 2014 [place], [place].  NEEDS ZOSTAVAX AND PREVNAR AT SUBSEQUENT VISITS. RETURN FOR WEIGHT CHECK IN 2-3 DAYS.

## 2013-04-30 LAB — COMPREHENSIVE METABOLIC PANEL WITH GFR
ALT: 39 IU/L (ref 0–44)
AST: 99 IU/L — ABNORMAL HIGH (ref 0–40)
Albumin/Globulin Ratio: 0.4 — ABNORMAL LOW (ref 1.1–2.5)
Albumin: 2 g/dL — ABNORMAL LOW (ref 3.6–4.8)
Alkaline Phosphatase: 187 IU/L — ABNORMAL HIGH (ref 39–117)
BUN/Creatinine Ratio: 15 (ref 10–22)
BUN: 11 mg/dL (ref 8–27)
CO2: 25 mmol/L (ref 18–29)
Calcium: 8.3 mg/dL — ABNORMAL LOW (ref 8.6–10.2)
Chloride: 101 mmol/L (ref 97–108)
Creatinine, Ser: 0.72 mg/dL — ABNORMAL LOW (ref 0.76–1.27)
GFR calc Af Amer: 112 mL/min/1.73 (ref >59)
GFR calc non Af Amer: 97 mL/min/1.73 (ref >59)
Globulin, Total: 4.8 g/dL — ABNORMAL HIGH (ref 1.5–4.5)
Glucose: 97 mg/dL (ref 65–99)
Potassium: 4.1 mmol/L (ref 3.5–5.2)
Sodium: 137 mmol/L (ref 134–144)
Total Bilirubin: 1 mg/dL (ref 0.0–1.2)
Total Protein: 6.8 g/dL (ref 6.0–8.5)

## 2013-04-30 LAB — CBC WITH DIFFERENTIAL
Basos: 1 %
Eos: 6 %
HCT: 35.5 % — ABNORMAL LOW (ref 37.5–51.0)
Hemoglobin: 12.4 g/dL — ABNORMAL LOW (ref 12.6–17.7)
Lymphocytes Absolute: 2.5 10*3/uL (ref 0.7–3.1)
Lymphs: 49 %
MCHC: 34.9 g/dL (ref 31.5–35.7)
MCV: 95 fL (ref 79–97)
Monocytes: 11 %
Neutrophils Absolute: 1.7 10*3/uL (ref 1.4–7.0)
WBC: 5.1 10*3/uL (ref 3.4–10.8)

## 2013-05-03 ENCOUNTER — Ambulatory Visit (INDEPENDENT_AMBULATORY_CARE_PROVIDER_SITE_OTHER): Payer: Medicare Other | Admitting: *Deleted

## 2013-05-03 VITALS — Wt 188.0 lb

## 2013-05-03 DIAGNOSIS — R609 Edema, unspecified: Secondary | ICD-10-CM

## 2013-05-03 DIAGNOSIS — K7460 Unspecified cirrhosis of liver: Secondary | ICD-10-CM

## 2013-05-03 DIAGNOSIS — R600 Localized edema: Secondary | ICD-10-CM

## 2013-05-03 NOTE — Progress Notes (Signed)
Patient came in for a weight check per Dr. Alvester MorinNewton weight today was 188lb  Weight on 12/29 was 192lb. I told patient that [facility] would [facility]

## 2013-05-06 ENCOUNTER — Ambulatory Visit (INDEPENDENT_AMBULATORY_CARE_PROVIDER_SITE_OTHER): Payer: Medicare Other | Admitting: Internal Medicine

## 2013-05-06 ENCOUNTER — Encounter (INDEPENDENT_AMBULATORY_CARE_PROVIDER_SITE_OTHER): Payer: Self-pay | Admitting: Internal Medicine

## 2013-05-06 VITALS — BP 116/80 | HR 84 | Temp 98.1°F | Resp 18 | Ht 68.0 in | Wt 189.9 lb

## 2013-05-06 DIAGNOSIS — B1920 Unspecified viral hepatitis C without hepatic coma: Secondary | ICD-10-CM

## 2013-05-06 DIAGNOSIS — K259 Gastric ulcer, unspecified as acute or chronic, without hemorrhage or perforation: Secondary | ICD-10-CM

## 2013-05-06 DIAGNOSIS — R188 Other ascites: Secondary | ICD-10-CM

## 2013-05-06 DIAGNOSIS — K7460 Unspecified cirrhosis of liver: Secondary | ICD-10-CM

## 2013-05-06 NOTE — Progress Notes (Signed)
Presenting complaint;  Followup for gastric ulcer, hepatitis C and cirrhosis.  Database;  Patient is 68 year old Hispanic male who was admitted to [facility] with upper GI bleed 8 weeks ago. He was found to have a large gastric ulcer. During that admission he was also found to have cirrhosis with ascites and hepatitis C. He has finished treatment for H. pylori infection. He has genotype 1b infection.   Subjective:  Patient is here for scheduled visit. He was last seen 5 weeks ago. He continues to complain of abdominal fullness but denies pain nausea vomiting melena or rectal bleeding. He has gained 7 pounds in the last 5 weeks. He has noted decrease in lower extremity edema. He did develop diarrhea while he was in Biaxin and amoxicillin for H. pylori infection but now it's slowing down and he is having 3-4 bowel movements per day. He denies fever or chills. He does not take NSAIDs.    Current Medications: Current Outpatient Prescriptions  Medication Sig Dispense Refill  . furosemide (LASIX) 40 MG tablet Take 1.5 tablets (60 mg total) by mouth daily.  60 tablet  3  . omeprazole (PRILOSEC) 40 MG capsule Take 1 capsule (40 mg total) by mouth daily.  60 capsule  3  . spironolactone (ALDACTONE) 50 MG tablet Take 1 tablet (50 mg total) by mouth daily.  30 tablet  1  . sucralfate (CARAFATE) 1 GM/10ML suspension Take 10 mLs (1 g total) by mouth 4 (four) times daily -  with meals and at bedtime.  420 mL  0   No current facility-administered medications for this visit.     Objective: Blood pressure 116/80, pulse 84, temperature 98.1 F (36.7 C), temperature source Oral, resp. rate 18, height 5\' 8"  (1.727 m), weight 189 lb 14.4 oz (86.138 kg). Patient is alert and does not have asterixis. Conjunctiva is pink. Sclera is nonicteric Oropharyngeal mucosa is normal. He has few teeth in lower jaw. No neck masses or thyromegaly noted. Cardiac exam with regular rhythm normal S1 and S2. No murmur or gallop  noted. Lungs are clear to auscultation. Abdomen is full but soft and nontender. Flanks are dull but no shifting noted. No hepatosplenomegaly.  He has one to 2+ pitting edema involving both legs.  Labs/studies Results: Lab data from 04/29/2013. WBC 5.1, H&H 12.4 and 35.5 and platelet count 189K. Serum sodium 137, potassium 4.1, chloride 101, CO2 25, BUN 11, creatinine 0.72, glucose 97. Bilirubin 1.0, AB 187, AST 99, ALT 39, total protein 6.8 and albumin 2.0, globulin 4.8 AFP on 04/01/2013 was 14.8. It was 17.7 on 03/13/2013. Abdominal pelvic CT on 03/23/2013 reveals cholelithiasis cirrhotic appearing liver and small amount of ascites.   Assessment:  #1. Peptic ulcer disease. He was found to have large gastric ulcer on EGD of 03/23/2013. He has been treated for H. pylori infection. He is also not taking NSAIDs.  #2. 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 He has decompensated disease and he has ascites but he will definitely benefit from treatment. #3. Ascites well controlled with treatment. #4. Mildly elevated AFP with improving trend felt to be nonspecific. No suspicious areas seen on CT of November 2014.   Plan:  Discontinue sucralfate. Continue omeprazole at 20 mg by mouth twice a day. Esophagogastroduodenoscopy on 05/09/2012 to document healing of this ulcer. If not healed biopsy would be taken. Quantitative HCV RNA by PCR at the time of EGD. Hep C therapy would be offered when  PUD healed. Office visit in 3 months. Patient to call if diarrhea worsens.

## 2013-05-06 NOTE — Patient Instructions (Signed)
Esophagogastroduodenoscopy on 05/09/2013 as planned. Notify if you have fever or diarrhea worsens.

## 2013-05-07 ENCOUNTER — Encounter (HOSPITAL_COMMUNITY): Payer: Self-pay | Admitting: Pharmacy Technician

## 2013-05-07 ENCOUNTER — Ambulatory Visit (INDEPENDENT_AMBULATORY_CARE_PROVIDER_SITE_OTHER): Payer: Medicare Other | Admitting: Internal Medicine

## 2013-05-09 ENCOUNTER — Encounter (HOSPITAL_COMMUNITY)
Admission: RE | Disposition: A | Discharge status: Home / Self Care | Payer: Self-pay | Source: Ambulatory Visit | Attending: Internal Medicine

## 2013-05-09 ENCOUNTER — Ambulatory Visit (HOSPITAL_COMMUNITY)
Admission: RE | Admit: 2013-05-09 | Discharge: 2013-05-09 | Disposition: A | Discharge status: Home / Self Care | Payer: Medicare Other | Source: Ambulatory Visit | Attending: Internal Medicine | Admitting: Internal Medicine

## 2013-05-09 ENCOUNTER — Encounter (HOSPITAL_COMMUNITY): Payer: Self-pay

## 2013-05-09 DIAGNOSIS — I8500 Esophageal varices without bleeding: Secondary | ICD-10-CM | POA: Insufficient documentation

## 2013-05-09 DIAGNOSIS — K449 Diaphragmatic hernia without obstruction or gangrene: Secondary | ICD-10-CM

## 2013-05-09 DIAGNOSIS — B1920 Unspecified viral hepatitis C without hepatic coma: Secondary | ICD-10-CM

## 2013-05-09 DIAGNOSIS — K319 Disease of stomach and duodenum, unspecified: Secondary | ICD-10-CM | POA: Insufficient documentation

## 2013-05-09 DIAGNOSIS — K259 Gastric ulcer, unspecified as acute or chronic, without hemorrhage or perforation: Secondary | ICD-10-CM

## 2013-05-09 DIAGNOSIS — Z09 Encounter for follow-up examination after completed treatment for conditions other than malignant neoplasm: Secondary | ICD-10-CM

## 2013-05-09 DIAGNOSIS — K7460 Unspecified cirrhosis of liver: Secondary | ICD-10-CM | POA: Insufficient documentation

## 2013-05-09 HISTORY — PX: ESOPHAGOGASTRODUODENOSCOPY: SHX5428

## 2013-05-09 SURGERY — EGD (ESOPHAGOGASTRODUODENOSCOPY)
Anesthesia: Moderate Sedation

## 2013-05-09 MED ORDER — SODIUM CHLORIDE 0.9 % IV SOLN
INTRAVENOUS | Status: DC
  Administered 2013-05-09: 16:00:00 via INTRAVENOUS

## 2013-05-09 MED ORDER — MEPERIDINE HCL 50 MG/ML IJ SOLN
INTRAMUSCULAR | Status: AC
  Filled 2013-05-09: qty 1

## 2013-05-09 MED ORDER — STERILE WATER FOR IRRIGATION IR SOLN
Status: DC | PRN
  Administered 2013-05-09: 17:00:00

## 2013-05-09 MED ORDER — MEPERIDINE HCL 50 MG/ML IJ SOLN
INTRAMUSCULAR | Status: DC | PRN
Start: 2013-05-09 — End: 2013-05-09
  Administered 2013-05-09: 25 mg via INTRAVENOUS

## 2013-05-09 MED ORDER — MIDAZOLAM HCL 5 MG/5ML IJ SOLN
INTRAMUSCULAR | Status: AC
  Filled 2013-05-09: qty 10

## 2013-05-09 MED ORDER — MIDAZOLAM HCL 5 MG/5ML IJ SOLN
INTRAMUSCULAR | Status: DC | PRN
  Administered 2013-05-09 (×2): 2 mg via INTRAVENOUS

## 2013-05-09 MED ORDER — SPIRONOLACTONE 50 MG PO TABS: 50.0000 mg | ORAL_TABLET | Freq: Two times a day (BID) | ORAL | Status: DC

## 2013-05-09 MED ORDER — ALBUTEROL SULFATE HFA 108 (90 BASE) MCG/ACT IN AERS: 2.0000 | INHALATION_SPRAY | Freq: Four times a day (QID) | RESPIRATORY_TRACT | Status: AC | PRN

## 2013-05-09 NOTE — H&P (Signed)
Aaron Gordon is an 68 y.o. male.   Chief Complaint: Patient is here for EGD. HPI: Patient is 68 year old male was admitted this facility in November 2014 with upper GI bleed and found a large gastric ulcer. He was also found to have cirrhosis and hepatitis C. He he quit drinking about 2 weeks prior to admission. He complains of bloating but denies nausea vomiting melena or rectal bleeding. He has been treated for H. pylori infection. These undergoing EGD to document healing of gastric ulcer before he is given therapy for hepatitis C. He states he's been wheezing off and on for 2 weeks. He denies fever. He is a smoker. His weight is up by 2 pounds since he was seen earlier this week  Past Medical History  Diagnosis Date  . Stomach ulcer 1970's  . Anemia   . Erosive esophagitis 03/15/2013  . Gastric ulcer with hemorrhage 03/15/2013  . Portal hypertensive gastropathy 03/15/2013  . 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 Alcohol abuse   . Hepatitis C antibody test positive 03/15/2013  . Hypoalbuminemia 03/15/2013  . Liver cirrhosis 03/12/2013    From ETOH and Hep C  . Tobacco abuse 03/15/2013  . H. pylori infection 03/15/2013  . Diastolic dysfunction 03/15/2013    Grade 1. Ejection fraction 60%.    Past Surgical History  Procedure Laterality Date  . Splenectomy, total  1971  . Esophagogastroduodenoscopy N/A 03/13/2013    Procedure: ESOPHAGOGASTRODUODENOSCOPY (EGD);  Surgeon: Malissa HippoNajeeb U Deetta Siegmann, MD;  Location: [facility];  Service: Endoscopy;  Laterality: N/A;    Family History  Problem Relation Age of Onset  . Cancer Mother     intestinal   Social History:  reports that he has been smoking Cigarettes.  He has a 23.5 pack-year smoking history. He has never used smokeless tobacco. He reports that he does not drink alcohol or use illicit drugs.  Allergies: No Known Allergies  Medications Prior to Admission  Medication Sig Dispense Refill  . furosemide (LASIX) 80 MG tablet  Take 80 mg by mouth daily.      Marland Kitchen. omeprazole (PRILOSEC) 40 MG capsule Take 1 capsule (40 mg total) by mouth daily.  60 capsule  3  . spironolactone (ALDACTONE) 50 MG tablet Take 1 tablet (50 mg total) by mouth daily.  30 tablet  1    No results found for this or any previous visit (from the past 48 hour(s)). No results found.  ROS  Blood pressure 128/86, pulse 92, temperature 97.7 F (36.5 C), temperature source Oral, resp. rate 19, height 5\' 8"  (1.727 m), weight 180 lb (81.647 kg), SpO2 95.00%. Physical Exam  Constitutional: He appears well-developed and well-nourished.  HENT:  Mouth/Throat: Oropharynx is clear and moist.  Eyes: Conjunctivae are normal.  Neck: No thyromegaly present.  Cardiovascular: Normal rate, regular rhythm and normal heart sounds.   No murmur heard. Respiratory: Effort normal.  Bilateral rhonchi  GI: Soft. He exhibits no mass. There is no tenderness. There is no rebound.  Musculoskeletal: Edema: 2+ pitting edema in both legs.  Lymphadenopathy:    He has no cervical adenopathy.  Neurological: He is alert.  Skin: Skin is warm and dry.     Assessment/Plan Gastric ulcer. EGD to document healing.  Kailani Brass U 05/09/2013, 4:34 PM

## 2013-05-09 NOTE — Op Note (Signed)
EGD PROCEDURE REPORT  PATIENT:  Aaron Gordon  MR#:  [id number] Birthdate:  1945-11-18, 68 y.o., male Endoscopist:  Dr. Malissa HippoNajeeb U. Rehman, MD Referred By:  Dr. Rudi Heaponald [name], MD  Procedure Date: 05/09/2013  Procedure:   EGD  Indications:  Patient is 68 year old Hispanic male was hospitalized with upper GI bleed 8 weeks ago and found to have large gastric ulcer. He has been treated for H. pylori gastritis. He is undergoing followup EGD to document healing of this ulcer otherwise biopsies will be taken. His esophagus would be reexamined to determine whether or not he has varices.            Informed Consent:  The risks, benefits, alternatives & imponderables which include, but are not limited to, bleeding, infection, perforation, drug reaction and potential missed lesion have been reviewed.  The potential for biopsy, lesion removal, esophageal dilation, etc. have also been discussed.  Questions have been answered.  All parties agreeable.  Please see history & physical in medical record for more information.  Medications:  Demerol 25 mg IV Versed 4 mg IV Cetacaine spray topically for oropharyngeal anesthesia  Description of procedure:  The endoscope was introduced through the mouth and advanced to the second portion of the duodenum without difficulty or limitations. The mucosal surfaces were surveyed very carefully during advancement of the scope and upon withdrawal.  Findings:  Esophagus: Previously identified ulcers have completely healed. Single short column of grade 1 esophageal varix noted;  GEJ:  34 cm Hiatus:  37 cm Stomach:  Stomach was empty and distended very well with insufflation. Folds in the proximal stomach were normal. Examination of mucosa at gastric body revealed mosaic pattern. Ulcer at angularis at least 90% healed. Ulcer site is now estimated to be 4-5 mm. Although the channel was patent. Fundus and cardia were unremarkable without gastric varices. Duodenum:  Normal bulbar and  post bulbar mucosa.  Therapeutic/Diagnostic Maneuvers Performed:  None  Complications:  None  Impression: Ulcerative esophagitis has completely healed. Single short column of grade 1 esophageal varix. Small sliding hiatal hernia. Portal gastropathy. Gastric ulcer at angularis  over 90% healed.  Recommendations:  Continue omeprazole at 40 mg by mouth daily for now. Patient advised to watch salt intake. Spironolactone increased to 50 mg by mouth twice a day. Albuterol in it 2 puffs 4 times a day when necessary. Office visit in a few weeks to discuss and initiate therapy for hepatitis C.  REHMAN,NAJEEB U  05/09/2013  4:57 PM  CC: Dr. Rudi HeapMOORE, [name], MD & Dr. No ref. provider found

## 2013-05-09 NOTE — Discharge Instructions (Signed)
Resume usual medications and diet. Increase Spironolactone to 50 mg twice daily. Albuterol inhaler 2 puffs up to 4 times a day as needed. No driving for 24 hours. Physician will contact you with results of blood test.  Esophagogastroduodenoscopy Care After Refer to this sheet in the next few weeks. These instructions provide you with information on caring for yourself after your procedure. Your caregiver may also give you more specific instructions. Your treatment has been planned according to current medical practices, but problems sometimes occur. Call your caregiver if you have any problems or questions after your procedure.  HOME CARE INSTRUCTIONS  Do not eat or drink anything until the numbing medicine (local anesthetic) has worn off and your gag reflex has returned. You will know that the local anesthetic has worn off when you can swallow comfortably.  Do not drive for 12 hours after the procedure or as directed by your caregiver.  Only take medicines as directed by your caregiver. SEEK MEDICAL CARE IF:   You cannot stop coughing.  You are not urinating at all or less than usual. SEEK IMMEDIATE MEDICAL CARE IF:  You have difficulty swallowing.  You cannot eat or drink.  You have worsening throat or chest pain.  You have dizziness, lightheadedness, or you faint.  You have nausea or vomiting.  You have chills.  You have a fever.  You have severe abdominal pain.  You have black, tarry, or bloody stools. Document Released: 04/04/2012 Document Reviewed: 04/04/2012 [facility] Patient Information 2014 [place].

## 2013-05-13 ENCOUNTER — Encounter (HOSPITAL_COMMUNITY): Payer: Self-pay | Admitting: Internal Medicine

## 2013-05-16 ENCOUNTER — Telehealth (INDEPENDENT_AMBULATORY_CARE_PROVIDER_SITE_OTHER): Payer: Self-pay | Admitting: *Deleted

## 2013-05-16 NOTE — Telephone Encounter (Signed)
Aaron Gordon, wife, would like to know if there is a list of foods low in sodium that can be sent to them. It is hard for her to finds things for him to eat to keep the fluid off. If not, could he be referred to a dietician? The return phone number is [phone].

## 2013-05-16 NOTE — Telephone Encounter (Signed)
Noted  

## 2013-05-20 ENCOUNTER — Telehealth: Payer: Self-pay | Admitting: Family Medicine

## 2013-05-20 NOTE — Telephone Encounter (Signed)
PT NEEDED HOSPITAL FOLLOW UP WITH DR NEWTON APPT SCHEDULED

## 2013-05-22 ENCOUNTER — Ambulatory Visit (INDEPENDENT_AMBULATORY_CARE_PROVIDER_SITE_OTHER): Payer: Medicare Other | Admitting: Family Medicine

## 2013-05-22 ENCOUNTER — Encounter: Payer: Self-pay | Admitting: Family Medicine

## 2013-05-22 ENCOUNTER — Inpatient Hospital Stay (HOSPITAL_COMMUNITY)
Admission: AD | Admit: 2013-05-22 | Discharge: 2013-05-30 | DRG: 433 | Disposition: A | Discharge status: Home / Self Care | Payer: Medicare Other | Source: Ambulatory Visit | Attending: Internal Medicine | Admitting: Internal Medicine

## 2013-05-22 ENCOUNTER — Inpatient Hospital Stay (HOSPITAL_COMMUNITY): Payer: Medicare Other

## 2013-05-22 ENCOUNTER — Encounter (HOSPITAL_COMMUNITY): Payer: Self-pay | Admitting: *Deleted

## 2013-05-22 VITALS — BP 130/79 | HR 90 | Temp 98.8°F | Ht 68.0 in | Wt 204.0 lb

## 2013-05-22 DIAGNOSIS — K254 Chronic or unspecified gastric ulcer with hemorrhage: Secondary | ICD-10-CM

## 2013-05-22 DIAGNOSIS — I5189 Other ill-defined heart diseases: Secondary | ICD-10-CM

## 2013-05-22 DIAGNOSIS — I509 Heart failure, unspecified: Secondary | ICD-10-CM | POA: Diagnosis present

## 2013-05-22 DIAGNOSIS — K766 Portal hypertension: Secondary | ICD-10-CM | POA: Diagnosis present

## 2013-05-22 DIAGNOSIS — R601 Generalized edema: Secondary | ICD-10-CM

## 2013-05-22 DIAGNOSIS — E8809 Other disorders of plasma-protein metabolism, not elsewhere classified: Secondary | ICD-10-CM

## 2013-05-22 DIAGNOSIS — I5030 Unspecified diastolic (congestive) heart failure: Secondary | ICD-10-CM | POA: Diagnosis present

## 2013-05-22 DIAGNOSIS — R188 Other ascites: Secondary | ICD-10-CM

## 2013-05-22 DIAGNOSIS — K208 Other esophagitis without bleeding: Secondary | ICD-10-CM | POA: Diagnosis present

## 2013-05-22 DIAGNOSIS — K7030 Alcoholic cirrhosis of liver without ascites: Secondary | ICD-10-CM

## 2013-05-22 DIAGNOSIS — E871 Hypo-osmolality and hyponatremia: Secondary | ICD-10-CM

## 2013-05-22 DIAGNOSIS — K921 Melena: Secondary | ICD-10-CM

## 2013-05-22 DIAGNOSIS — K2210 Ulcer of esophagus without bleeding: Secondary | ICD-10-CM

## 2013-05-22 DIAGNOSIS — K3189 Other diseases of stomach and duodenum: Secondary | ICD-10-CM

## 2013-05-22 DIAGNOSIS — D62 Acute posthemorrhagic anemia: Secondary | ICD-10-CM

## 2013-05-22 DIAGNOSIS — K319 Disease of stomach and duodenum, unspecified: Secondary | ICD-10-CM | POA: Diagnosis present

## 2013-05-22 DIAGNOSIS — K7460 Unspecified cirrhosis of liver: Secondary | ICD-10-CM

## 2013-05-22 DIAGNOSIS — F1020 Alcohol dependence, uncomplicated: Secondary | ICD-10-CM | POA: Diagnosis present

## 2013-05-22 DIAGNOSIS — R7689 Other specified abnormal immunological findings in serum: Secondary | ICD-10-CM

## 2013-05-22 DIAGNOSIS — R768 Other specified abnormal immunological findings in serum: Secondary | ICD-10-CM | POA: Diagnosis present

## 2013-05-22 DIAGNOSIS — Z9089 Acquired absence of other organs: Secondary | ICD-10-CM

## 2013-05-22 DIAGNOSIS — D638 Anemia in other chronic diseases classified elsewhere: Secondary | ICD-10-CM | POA: Diagnosis present

## 2013-05-22 DIAGNOSIS — B182 Chronic viral hepatitis C: Secondary | ICD-10-CM | POA: Diagnosis present

## 2013-05-22 DIAGNOSIS — R609 Edema, unspecified: Secondary | ICD-10-CM

## 2013-05-22 DIAGNOSIS — N5089 Other specified disorders of the male genital organs: Secondary | ICD-10-CM | POA: Diagnosis present

## 2013-05-22 DIAGNOSIS — D72829 Elevated white blood cell count, unspecified: Secondary | ICD-10-CM | POA: Diagnosis present

## 2013-05-22 DIAGNOSIS — A048 Other specified bacterial intestinal infections: Secondary | ICD-10-CM

## 2013-05-22 DIAGNOSIS — J40 Bronchitis, not specified as acute or chronic: Secondary | ICD-10-CM | POA: Diagnosis present

## 2013-05-22 DIAGNOSIS — J209 Acute bronchitis, unspecified: Secondary | ICD-10-CM | POA: Diagnosis not present

## 2013-05-22 DIAGNOSIS — G4700 Insomnia, unspecified: Secondary | ICD-10-CM | POA: Diagnosis present

## 2013-05-22 DIAGNOSIS — I8500 Esophageal varices without bleeding: Secondary | ICD-10-CM

## 2013-05-22 DIAGNOSIS — D649 Anemia, unspecified: Secondary | ICD-10-CM | POA: Insufficient documentation

## 2013-05-22 DIAGNOSIS — Z720 Tobacco use: Secondary | ICD-10-CM

## 2013-05-22 DIAGNOSIS — I8510 Secondary esophageal varices without bleeding: Secondary | ICD-10-CM

## 2013-05-22 DIAGNOSIS — F1010 Alcohol abuse, uncomplicated: Secondary | ICD-10-CM

## 2013-05-22 DIAGNOSIS — F17200 Nicotine dependence, unspecified, uncomplicated: Secondary | ICD-10-CM | POA: Diagnosis present

## 2013-05-22 DIAGNOSIS — R600 Localized edema: Secondary | ICD-10-CM

## 2013-05-22 LAB — COMPREHENSIVE METABOLIC PANEL
ALT: 30 U/L (ref 0–53)
AST: 74 U/L — AB (ref 0–37)
Albumin: 1.4 g/dL — ABNORMAL LOW (ref 3.5–5.2)
Alkaline Phosphatase: 153 U/L — ABNORMAL HIGH (ref 39–117)
BUN: 17 mg/dL (ref 6–23)
CALCIUM: 8 mg/dL — AB (ref 8.4–10.5)
CO2: 26 mEq/L (ref 19–32)
Chloride: 101 mEq/L (ref 96–112)
Creatinine, Ser: 0.86 mg/dL (ref 0.50–1.35)
GFR calc Af Amer: >90 mL/min (ref >90)
GFR calc non Af Amer: 88 mL/min — ABNORMAL LOW (ref >90)
Glucose, Bld: 99 mg/dL (ref 70–99)
Potassium: 4.3 mEq/L (ref 3.7–5.3)
SODIUM: 135 meq/L — AB (ref 137–147)
TOTAL PROTEIN: 6.8 g/dL (ref 6.0–8.3)
Total Bilirubin: 0.8 mg/dL (ref 0.3–1.2)

## 2013-05-22 LAB — CBC
HCT: 32.3 % — ABNORMAL LOW (ref 39.0–52.0)
Hemoglobin: 11.4 g/dL — ABNORMAL LOW (ref 13.0–17.0)
MCH: 34 pg (ref 26.0–34.0)
MCHC: 35.3 g/dL (ref 30.0–36.0)
MCV: 96.4 fL (ref 78.0–100.0)
PLATELETS: 193 10*3/uL (ref 150–400)
RBC: 3.35 MIL/uL — AB (ref 4.22–5.81)
RDW: 16.4 % — ABNORMAL HIGH (ref 11.5–15.5)
WBC: 7.8 10*3/uL (ref 4.0–10.5)

## 2013-05-22 LAB — PROTIME-INR
INR: 1.27 (ref 0.00–1.49)
Prothrombin Time: 15.6 seconds — ABNORMAL HIGH (ref 11.6–15.2)

## 2013-05-22 LAB — APTT: aPTT: 32 seconds (ref 24–37)

## 2013-05-22 MED ORDER — ACETAMINOPHEN 325 MG PO TABS: 650.0000 mg | ORAL_TABLET | Freq: Four times a day (QID) | ORAL | Status: DC | PRN

## 2013-05-22 MED ORDER — HYDROCODONE-ACETAMINOPHEN 5-325 MG PO TABS
1.0000 | ORAL_TABLET | ORAL | Status: DC | PRN
  Administered 2013-05-22 (×2): 2 via ORAL
  Filled 2013-05-22 (×2): qty 2

## 2013-05-22 MED ORDER — SODIUM CHLORIDE 0.9 % IJ SOLN
3.0000 mL | Freq: Two times a day (BID) | INTRAMUSCULAR | Status: DC
  Administered 2013-05-22 – 2013-05-30 (×11): 3 mL via INTRAVENOUS

## 2013-05-22 MED ORDER — SPIRONOLACTONE 25 MG PO TABS
50.0000 mg | ORAL_TABLET | Freq: Two times a day (BID) | ORAL | Status: DC
  Administered 2013-05-22 – 2013-05-23 (×4): 50 mg via ORAL
  Filled 2013-05-22 (×4): qty 2

## 2013-05-22 MED ORDER — ENOXAPARIN SODIUM 40 MG/0.4ML ~~LOC~~ SOLN
40.0000 mg | SUBCUTANEOUS | Status: DC
  Administered 2013-05-22: 40 mg via SUBCUTANEOUS
  Filled 2013-05-22: qty 0.4

## 2013-05-22 MED ORDER — ALBUMIN HUMAN 25 % IV SOLN
50.0000 g | Freq: Every day | INTRAVENOUS | Status: AC
  Administered 2013-05-22 – 2013-05-24 (×3): 50 g via INTRAVENOUS
  Filled 2013-05-22 (×3): qty 200

## 2013-05-22 MED ORDER — ACETAMINOPHEN 650 MG RE SUPP: 650.0000 mg | Freq: Four times a day (QID) | RECTAL | Status: DC | PRN

## 2013-05-22 MED ORDER — SODIUM CHLORIDE 0.9 % IV SOLN: 250.0000 mL | INTRAVENOUS | Status: DC | PRN

## 2013-05-22 MED ORDER — SODIUM CHLORIDE 0.9 % IJ SOLN: 3.0000 mL | INTRAMUSCULAR | Status: DC | PRN

## 2013-05-22 MED ORDER — TIOTROPIUM BROMIDE MONOHYDRATE 18 MCG IN CAPS
18.0000 ug | ORAL_CAPSULE | Freq: Every day | RESPIRATORY_TRACT | Status: DC
  Administered 2013-05-23 – 2013-05-30 (×6): 18 ug via RESPIRATORY_TRACT
  Filled 2013-05-22 (×2): qty 5

## 2013-05-22 MED ORDER — FUROSEMIDE 10 MG/ML IJ SOLN
40.0000 mg | Freq: Two times a day (BID) | INTRAMUSCULAR | Status: DC
  Administered 2013-05-22 – 2013-05-25 (×6): 40 mg via INTRAVENOUS
  Filled 2013-05-22 (×6): qty 4

## 2013-05-22 MED ORDER — ALBUTEROL SULFATE (2.5 MG/3ML) 0.083% IN NEBU
2.5000 mg | INHALATION_SOLUTION | Freq: Four times a day (QID) | RESPIRATORY_TRACT | Status: DC
  Administered 2013-05-22 – 2013-05-30 (×28): 2.5 mg via RESPIRATORY_TRACT
  Filled 2013-05-22 (×29): qty 3

## 2013-05-22 MED ORDER — ALBUTEROL SULFATE (2.5 MG/3ML) 0.083% IN NEBU
2.5000 mg | INHALATION_SOLUTION | RESPIRATORY_TRACT | Status: DC | PRN
Start: 2013-05-22 — End: 2013-05-30

## 2013-05-22 MED ORDER — ONDANSETRON HCL 4 MG/2ML IJ SOLN: 4.0000 mg | Freq: Four times a day (QID) | INTRAMUSCULAR | Status: DC | PRN

## 2013-05-22 MED ORDER — PANTOPRAZOLE SODIUM 40 MG PO TBEC
40.0000 mg | DELAYED_RELEASE_TABLET | Freq: Two times a day (BID) | ORAL | Status: DC
  Administered 2013-05-22 – 2013-05-25 (×7): 40 mg via ORAL
  Filled 2013-05-22 (×7): qty 1

## 2013-05-22 MED ORDER — ONDANSETRON HCL 4 MG PO TABS: 4.0000 mg | ORAL_TABLET | Freq: Four times a day (QID) | ORAL | Status: DC | PRN

## 2013-05-22 NOTE — H&P (Signed)
[facility] History and Physical  Aaron MiniumJose L Stamey MVH:[id number] DOB: 1945/11/16 DOA: 05/22/2013  Referring physician: Alvester MorinNewton PCP: Rudi HeapMOORE, [name], MD   Chief Complaint: LE edema  HPI: Aaron Gordon is a 68 y.o. male with a past medical history that includes gastric ulcer status post EGD, cirrhosis, hepatitis C, esophageal varices, portal hypertension H. pylori infection, diastolic dysfunction since to resume 312 at any [facility] from his primary care office with the chief complaint of worsening lower extremity edema. Patient had a followup endoscopy on January 8 that showed a healed gastric ulcer with 1 esophageal varices. He reports that since that procedure his lower extremity edema has worsened. Chart review indicates that at that time his Lasix was increased to 80 mg daily. Associated symptoms include bilateral leg pain particularly with walking, worsening abdominal distention, weight gain, increased wheezing and worsening shortness of breath. He denies any chest pain palpitations. He denies any abdominal pain, nausea vomiting. He denies diarrhea constipation melena or bright red blood per rectum. He reports compliance with his medications. He deniesNSAIDS. He reports his last drink was in October of 2014. Primary care provider's note indicates a 24 pound weight gain since 04/29/2013. We are asked to admit for further evaluation and treat    Review of Systems:    10 point review of systems completed and all systems are negative except as indicated by history of present illness   Past Medical History  Diagnosis Date  . Stomach ulcer 1970's  . Anemia   . Erosive esophagitis 03/15/2013  . Gastric ulcer with hemorrhage 03/15/2013  . Portal hypertensive gastropathy 03/15/2013  . 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 Alcohol abuse   . Hepatitis C antibody test positive 03/15/2013  . Hypoalbuminemia 03/15/2013  . Liver cirrhosis 03/12/2013    From ETOH and Hep C  . Tobacco  abuse 03/15/2013  . H. pylori infection 03/15/2013  . Diastolic dysfunction 03/15/2013    Grade 1. Ejection fraction 60%.   Past Surgical History  Procedure Laterality Date  . Splenectomy, total  1971  . Esophagogastroduodenoscopy N/A 03/13/2013    Procedure: ESOPHAGOGASTRODUODENOSCOPY (EGD);  Surgeon: Malissa HippoNajeeb U Rehman, MD;  Location: [facility];  Service: Endoscopy;  Laterality: N/A;  . Esophagogastroduodenoscopy N/A 05/09/2013    Procedure: ESOPHAGOGASTRODUODENOSCOPY (EGD);  Surgeon: Malissa HippoNajeeb U Rehman, MD;  Location: [facility];  Service: Endoscopy;  Laterality: N/A;  340   Social History:  reports that he has been smoking Cigarettes.  He has a 11.75 pack-year smoking history. He has never used smokeless tobacco. He reports that he does not drink alcohol or use illicit drugs. stop drinking in October of 2014. He is married lives with his wife. He is a retired laborer the  No Known Allergies  Family History  Problem Relation Age of Onset  . Cancer Mother     intestinal     Prior to Admission medications   Medication Sig Start Date End Date Taking? Authorizing Provider  albuterol (PROVENTIL HFA;VENTOLIN HFA) 108 (90 BASE) MCG/ACT inhaler Inhale 2 puffs into the lungs every 6 (six) hours as needed for wheezing or shortness of breath. 05/09/13   Malissa HippoNajeeb U Rehman, MD  furosemide (LASIX) 80 MG tablet Take 80 mg by mouth daily.    Historical Provider, MD  omeprazole (PRILOSEC) 40 MG capsule Take 1 capsule (40 mg total) by mouth daily. 04/01/13   Len Blalockerri L Setzer, NP  spironolactone (ALDACTONE) 50 MG tablet Take 1 tablet (50 mg  total) by mouth 2 (two) times daily. 05/09/13   Malissa Hippo, MD   Physical Exam: Filed Vitals:   05/22/13 1309  BP: 122/78  Pulse: 93  Temp: 97.7 F (36.5 C)  Resp: 20    BP 122/78  Pulse 93  Temp(Src) 97.7 F (36.5 C) (Oral)  Resp 20  Ht 5\' 8"  (1.727 m)  Wt 92.534 kg (204 lb)  BMI 31.03 kg/m2  SpO2 10%  General:  Appears calm and comfortable Eyes:  PERRL, normal lids, irises & conjunctiva ENT: ears are clear nose without drainage oropharynx without erythema or exudate. Mucous membranes of his mouth are pink and moist. He is very poor dentition Neck: no LAD, masses or thyromegaly Cardiovascular: RRR, no murmur no gallop no rub. Lower extremities with 3-4+ pitting edema from feet to mid thigh bilaterally  Respiratory: Normal effort. Breath sounds with slightly diminished airflow faint expiratory wheeze diffusely.  Abdomen: very distended. Positive bowel sounds throughout nontender to palpation Skin: several scattered small healing lesions particularly on chest arms and back. No other rashes noted  Musculoskeletal: grossly normal tone BUE/BLE Psychiatric: grossly normal mood and affect, speech fluent and appropriate Neurologic: speech is clear facial symmetry radial nerves II through XII grossly intact           Labs on Admission:  Basic Metabolic Panel: No results found for this basename: NA, K, CL, CO2, GLUCOSE, BUN, CREATININE, CALCIUM, MG, PHOS,  in the last 168 hours Liver Function Tests: No results found for this basename: AST, ALT, ALKPHOS, BILITOT, PROT, ALBUMIN,  in the last 168 hours No results found for this basename: LIPASE, AMYLASE,  in the last 168 hours No results found for this basename: AMMONIA,  in the last 168 hours CBC: No results found for this basename: WBC, NEUTROABS, HGB, HCT, MCV, PLT,  in the last 168 hours Cardiac Enzymes: No results found for this basename: CKTOTAL, CKMB, CKMBINDEX, TROPONINI,  in the last 168 hours  BNP (last 3 results) No results found for this basename: PROBNP,  in the last 8760 hours CBG: No results found for this basename: GLUCAP,  in the last 168 hours  Radiological Exams on Admission: No results found.  EKG:   Assessment/Plan Principal Problem:   Anasarca: Likely related to liver disease and portal hypertension. A limit medical diet. Will provide Lasix IV and continue his  home spironolactone. Review indicates his optimal dry weight is 180 pounds. Monitor weight daily and obtain strict intake and output.  Active Problems: Ascites: Worsening in patient with liver cirrhosis and hepatitis C. Will request ultrasound guided paracentesis for tomorrow. Continue spironolactone and Lasix as indicated above.   Hepatitis C antibody test positive: Chart Review indicates further workup by GI is pending based on resolution of gastric ulcer. Resolution of gastric ulcer documented on January 8 status post EGD. Chart indicates patient to followup with Dr. Karilyn Cota on in 3 weeks from that time to discuss and initiate therapy for hepatitis C. Will request GI consult.   Erosive esophagitis/gastric ulcer: Patient diagnosed November 2014. He underwent a EGD 05/09/2013 to document resolution of gastric ulcer. Ulcerative esophagitis had completely healed there was one grade 1 esophageal varices. Will continue PPI  Liver cirrhosis: Patient sees Dr. Karilyn Cota. Will request GI consult.     Diastolic dysfunction: Chart review indicates EF 60% and grade 1 diastolic dysfunction. Spironolactone and lasix as above. Will monitor daily weights and obtain intake and output  Tobacco abuse: offered cessation counseling  Alcohol abuse: Patient's last drink was in October 2014        Code Status: full Family Communication: none present Disposition Plan: home when ready  Time spent: 65 minutes  [facility] Pager [phone]

## 2013-05-22 NOTE — H&P (Signed)
Patient seen, independently examined and chart reviewed. I agree with exam, assessment and plan discussed with Aaron SmothersKaren Black, NP.  Pleasant 68 year old man with history of hepatitis C, esophageal varices, cirrhosis, alcohol use, gastric ulcer who presented to his primary care physician's office today with increasing edema. He was found to have anasarca and referred directly for admission.  Besides edema he feels okay. No shortness of breath.  Appears calm and comfortable. Afebrile, vital signs stable. Well-appearing. Cardiovascular regular rate and rhythm. Respiratory clear to auscultation bilaterally. No wheezes, rales or rhonchi. Normal respiratory effort. 2-2+ edema bilateral lower extremities extending to the abdomen.  Basic metabolic panel unremarkable. Hepatic function panel appears stable. Hemoglobin appears stable..  Main issues anasarca, presumably secondary to cirrhosis. 2-D echocardiogram from 03/2013 with normal LVEF. Grade 1 diastolic dysfunction.  Plan diuresis. Discussed with Dr. Karilyn Cotaehman.  SCDs for DVT prophylaxis with chronic thrombocytopenia.  Aaron Sacksaniel Gila Lauf, MD [facility] [phone]

## 2013-05-22 NOTE — Consult Note (Signed)
Referring Provider: No ref. provider found Primary Care Physician:  Rudi HeapMOORE, [name], MD Primary Gastroenterologist:  Dr. Karilyn Cotaehman  Reason for Consultation: Fluid overload in a patient with known cirrhosis.  HPI:  Patient is 68 year old Hispanic male who was [facility] in November 2014 [facility] He was found to have large gastric ulcer, positive H. pylori serology and cirrhosis felt to be secondary to chronic hepatitis C. Patient gives history of drinking alcohol daily and he was advised to quit. He has not had any alcohol in about 10 weeks. Patient was treated for H. pylori gastritis. He was last seen in the office on 05/06/2013 when he weighed 189 pounds and an exam suggested minimal ascites but he did have lower extremity edema. He returned for EGD on 05/09/2013. He was noted to have healed esophagitis and gastric ulcer was almost completely healed. He had single short column of grade 1 esophageal varix, portal gastropathy and small sliding hiatal hernia. Plans are made to get him approved for therapy for chronic hepatitis C. Patient noted increasing edema to lower extremities and a few days ago he developed scrotal and penile edema. He was seen by Dr. Doree AlbeeSteven Newton who contacted Dr. Irene LimboGoodrich and patient was directly hospitalized. Patient states since he remains on low salt diet. He has had good appetite. He denies nausea vomiting melena rectal bleeding diarrhea or constipation. He has noted vague discomfort in right upper quadrant. He denies shortness of breath. He was given albuterol inhaler, saw him in the office over 2 weeks ago. He is trying his best to quit cigarette smoking and now down to 3 cigarettes per day. He reassures me that he has not had even a drop of alcohol in the last 10 weeks. He has been taking both diuretics as recommended  Past Medical History  Diagnosis Date  . Stomach ulcer 1970's  . Anemia   . Erosive esophagitis 03/15/2013  . Gastric ulcer with  hemorrhage 03/15/2013  . Portal hypertensive gastropathy 03/15/2013  . 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 Alcohol abuse   . Hepatitis C antibody test positive 03/15/2013  . Hypoalbuminemia 03/15/2013  . Liver cirrhosis 03/12/2013    From ETOH and Hep C  . Tobacco abuse 03/15/2013  . H. pylori infection 03/15/2013  . Diastolic dysfunction 03/15/2013    Grade 1. Ejection fraction 60%.    Past Surgical History  Procedure Laterality Date  . Splenectomy, total  1971  . Esophagogastroduodenoscopy N/A 03/13/2013    Procedure: ESOPHAGOGASTRODUODENOSCOPY (EGD);  Surgeon: Malissa HippoNajeeb U Shaneen Reeser, MD;  [facility];  Service: Endoscopy;  Laterality: N/A;  . Esophagogastroduodenoscopy N/A 05/09/2013    Procedure: ESOPHAGOGASTRODUODENOSCOPY (EGD);  Surgeon: Malissa HippoNajeeb U Cela Newcom, MD;  Location: [facility];  Service: Endoscopy;  Laterality: N/A;  340    Prior to Admission medications   Medication Sig Start Date End Date Taking? Authorizing Provider  albuterol (PROVENTIL HFA;VENTOLIN HFA) 108 (90 BASE) MCG/ACT inhaler Inhale 2 puffs into the lungs every 6 (six) hours as needed for wheezing or shortness of breath. 05/09/13   Malissa HippoNajeeb U Dvon Jiles, MD  furosemide (LASIX) 80 MG tablet Take 80 mg by mouth daily.    Historical Provider, MD  omeprazole (PRILOSEC) 40 MG capsule Take 1 capsule (40 mg total) by mouth daily. 04/01/13   Len Blalockerri L Setzer, NP  spironolactone (ALDACTONE) 50 MG tablet Take 1 tablet (50 mg total) by mouth 2 (two) times daily. 05/09/13   Malissa HippoNajeeb U Briseidy Spark,  MD    Current Facility-Administered Medications  Medication Dose Route Frequency Provider Last Rate Last Dose  . 0.9 %  sodium chloride infusion  250 mL Intravenous PRN Gwenyth Bender, NP      . acetaminophen (TYLENOL) tablet 650 mg  650 mg Oral Q6H PRN Gwenyth Bender, NP       Or  . acetaminophen (TYLENOL) suppository 650 mg  650 mg Rectal Q6H PRN Gwenyth Bender, NP      . albuterol (PROVENTIL) (2.5 MG/3ML) 0.083% nebulizer  solution 2.5 mg  2.5 mg Nebulization Q6H Lesle Chris Black, NP      . albuterol (PROVENTIL) (2.5 MG/3ML) 0.083% nebulizer solution 2.5 mg  2.5 mg Nebulization Q2H PRN Gwenyth Bender, NP      . enoxaparin (LOVENOX) injection 40 mg  40 mg Subcutaneous Q24H Lesle Chris Black, NP      . furosemide (LASIX) injection 40 mg  40 mg Intravenous Q12H Lesle Chris Black, NP      . HYDROcodone-acetaminophen (NORCO/VICODIN) 5-325 MG per tablet 1-2 tablet  1-2 tablet Oral Q4H PRN Gwenyth Bender, NP      . ondansetron [facility]) tablet 4 mg  4 mg Oral Q6H PRN Gwenyth Bender, NP       Or  . ondansetron [facility]) injection 4 mg  4 mg Intravenous Q6H PRN Gwenyth Bender, NP      . pantoprazole (PROTONIX) EC tablet 40 mg  40 mg Oral BID Lesle Chris Black, NP      . sodium chloride 0.9 % injection 3 mL  3 mL Intravenous Q12H Lesle Chris Black, NP      . sodium chloride 0.9 % injection 3 mL  3 mL Intravenous PRN Gwenyth Bender, NP      . spironolactone (ALDACTONE) tablet 50 mg  50 mg Oral BID Lesle Chris Black, NP      . tiotropium [facility]) inhalation capsule 18 mcg  18 mcg Inhalation Daily Gwenyth Bender, NP        Allergies as of 05/22/2013  . (No Known Allergies)    Family History  Problem Relation Age of Onset  . Cancer Mother     intestinal    History   Social History  . Marital Status: Unknown    Spouse Name: N/A    Number of Children: N/A  . Years of Education: N/A   Occupational History  . Not on file.   Social History Main Topics  . Smoking status: Current Every Day Smoker -- 0.25 packs/day for 47 years    Types: Cigarettes  . Smokeless tobacco: Never Used     Comment: Patient statest he smokes 1-2 cigarettes a day  . Alcohol Use: No     Comment: former daily drinker - patient statest that he stopped 2 months ago.  . Drug Use: No  . Sexual Activity: No   Other Topics Concern  . Not on file   Social History Narrative  . No narrative on file    Review of Systems: See HPI, otherwise normal ROS  Physical  Exam: Temp:  [97.7 F (36.5 C)-98.8 F (37.1 C)] 97.7 F (36.5 C) (01/21 1309) Pulse Rate:  [90-93] 93 (01/21 1309) Resp:  [20] 20 (01/21 1309) BP: (122-130)/(78-79) 122/78 mmHg (01/21 1309) SpO2:  [10 %-96 %] 10 % (01/21 1309) Weight:  [203 lb 11.3 oz (92.4 kg)-204 lb (92.534 kg)] 204 lb (92.534 kg) (01/21 1309) Pleasant well-developed male who is in no  acute distress. He is alert and does not have asterixis. Conjunctiva is pink. Sclerae nonicteric. No neck masses or thyromegaly noted. Cardiac exam with regular rhythm normal S1 and S2. No murmur or gallop noted. Auscultation of lungs revealed visit your breath sounds bilaterally. He has some rhonchi which appear to be coming from upper airway. Abdomen is full but not distended or tightness. He has mild tenderness below the right costal margin. Liver spleen is nonpalpable. He has edema to abdominal wall as well as flanks and back. He has scrotal and penile edema. Foleys catheter is in place. He has 3-4+ pitting edema involving both legs. He also has pitting edema involving both thighs.    BMET  Recent Labs  05/22/13 1420  NA 135*  K 4.3  CL 101  CO2 26  GLUCOSE 99  BUN 17  CREATININE 0.86  CALCIUM 8.0*   LFT  Recent Labs  05/22/13 1420  PROT 6.8  ALBUMIN 1.4*  AST 74*  ALT 30  ALKPHOS 153*  BILITOT 0.8   PT/INR No results found for this basename: LABPROT, INR,  in the last 72 hours Hepatitis Panel No results found for this basename: HEPBSAG, HCVAB, HEPAIGM, HEPBIGM,  in the last 72 hours  Studies/Results: No results found.  Assessment; Patient is 68 year old male who presents with anasarca. He has history of ascites and has been on furosemide and spironolactone. Abdominal exam does not suggest tense ascites and this is confirmed with an ultrasound which shows minimal ascites not enough to be tapped. Serum albumin is quite low at 1.4 g. It was 2 g 3 weeks ago. His hemoglobin is 11.4 g which is better than  has been before. Dropping serum albumin is worrisome and may indicate progression of his liver disease. He did have echocardiography in November 2014 and had well preserved LV function and grade 1 diastolic dysfunction. I doubt that anasarca is due to cardiac disease but he certainly could have COPD on treatment into anasarca. He would benefit from IV albumin and IV Lasix. Agree with leaving him on spironolactone. I am still inclined to offer him treatment for hepatitis C even though he has decompensated disease. If antiviral therapy is effective, may improve hepatic function.  Recommendations;  Albumin 50 gm IV followed by Furosemide 40 IV daily for 3 days. Hepatic ultrasound with Doppler to check for patency of portal and hepatic veins in a.m. 2 g sodium high protein diet.   LOS: 0 days   Omara Alcon U  05/22/2013, 3:18 PM

## 2013-05-22 NOTE — Progress Notes (Signed)
   Subjective:    Patient ID: Aaron Gordon, male    DOB: [birth year], 68 y.o.   MRN: [id number]  HPI  Patient presents today for general followup. Patient with prior history of gastric ulcer and hemorrhage based on EGD. Also with secondary alcoholic cirrhotic disease and portal hypertension. Patient had followup endoscopy on January 8 that showed a healed gastric ulcer with one esophageal varix. Patient has been on Aldactone and Lasix since initial hospital discharge November 2014. Lasix dose was increased to 80 mg daily by Dr.Rehman s/p recent EGD.  Patient reports that he has had progressive abdominal distention as well as lower extremity swelling over the past 2 weeks. Patient denies any alcohol intake. No NSAIDs or salt intake. No dyspnea. No abdominal pain. Has been compliant with medication regimen. Euvolemic weight is around 180 pounds  Patient Active Problem List   Diagnosis Date Noted  . Erosive esophagitis 03/15/2013  . Gastric ulcer with hemorrhage 03/15/2013  . Portal hypertensive gastropathy 03/15/2013  . Acute blood loss anemia 03/15/2013  . Ascites 03/15/2013  . Hepatitis C antibody test positive 03/15/2013  . Hypoalbuminemia 03/15/2013  . Hyponatremia 03/15/2013  . Tobacco abuse 03/15/2013  . H. pylori infection 03/15/2013  . Diastolic dysfunction 03/15/2013  . Melena 03/12/2013  . Alcohol abuse 03/12/2013  . Liver cirrhosis 03/12/2013  . Pedal edema 03/12/2013      Review of Systems  All other systems reviewed and are negative.       Objective:   Physical Exam  Constitutional: He is oriented to person, place, and time.  Mildly disheveled appearing   HENT:  Head: Normocephalic and atraumatic.  Eyes: Conjunctivae are normal. Pupils are equal, round, and reactive to light. No scleral icterus.  Neck: Normal range of motion. Neck supple.  Cardiovascular: Normal rate and regular rhythm.   Pulmonary/Chest: Effort normal and breath sounds normal.    Abdominal: Bowel sounds are normal. He exhibits distension.  Significant abdominal distention Nontender  Musculoskeletal:  4+ pitting edema bilaterally.  Neurological: He is alert and oriented to person, place, and time.  Skin: Skin is warm.   Filed Vitals:   05/22/13 1023  BP: 130/79  Pulse: 90  Temp: 98.8 F (37.1 C)    Filed Weights   05/22/13 1023  Weight: 204 lb (92.534 kg)          Assessment & Plan:  Liver cirrhosis, alcoholic  Portal hypertension with esophageal varices  Anasarca  Patient is noted to have a 24 pound weight gain since last clinical visit in the setting of progressively worsening lower extremity edema with baseline short disease and portal hypertension. Had a relatively lengthy discussion with patient. Recommend the patient be admitted to the hospital for IV diuresis as well as questionable paracentesis. Case was discussed with Dr. Lindell NoeGooding at any time. Patient will be directly admitted to a medical bed. The patient otherwise clinically stable currently. Bed was confirmed prior to leaving the clinic today.

## 2013-05-23 ENCOUNTER — Inpatient Hospital Stay (HOSPITAL_COMMUNITY): Payer: Medicare Other

## 2013-05-23 LAB — CBC
HEMATOCRIT: 31.6 % — AB (ref 39.0–52.0)
Hemoglobin: 11 g/dL — ABNORMAL LOW (ref 13.0–17.0)
MCH: 33.5 pg (ref 26.0–34.0)
MCHC: 34.8 g/dL (ref 30.0–36.0)
MCV: 96.3 fL (ref 78.0–100.0)
PLATELETS: 169 10*3/uL (ref 150–400)
RBC: 3.28 MIL/uL — ABNORMAL LOW (ref 4.22–5.81)
RDW: 16.6 % — ABNORMAL HIGH (ref 11.5–15.5)
WBC: 9.4 10*3/uL (ref 4.0–10.5)

## 2013-05-23 LAB — BASIC METABOLIC PANEL
BUN: 18 mg/dL (ref 6–23)
CHLORIDE: 102 meq/L (ref 96–112)
CO2: 24 meq/L (ref 19–32)
Calcium: 8 mg/dL — ABNORMAL LOW (ref 8.4–10.5)
Creatinine, Ser: 0.89 mg/dL (ref 0.50–1.35)
GFR calc Af Amer: >90 mL/min (ref >90)
GFR calc non Af Amer: 87 mL/min — ABNORMAL LOW (ref >90)
Glucose, Bld: 92 mg/dL (ref 70–99)
Potassium: 3.9 mEq/L (ref 3.7–5.3)
SODIUM: 136 meq/L — AB (ref 137–147)

## 2013-05-23 MED ORDER — ZOLPIDEM TARTRATE 5 MG PO TABS
5.0000 mg | ORAL_TABLET | Freq: Every evening | ORAL | Status: DC | PRN
  Administered 2013-05-23: 5 mg via ORAL
  Filled 2013-05-23: qty 1

## 2013-05-23 MED ORDER — METOLAZONE 5 MG PO TABS
5.0000 mg | ORAL_TABLET | Freq: Once | ORAL | Status: AC
  Administered 2013-05-23: 5 mg via ORAL
  Filled 2013-05-23: qty 1

## 2013-05-23 NOTE — Progress Notes (Signed)
Patient underwent ultrasound earlier today. I went over the study with Dr. Daylene PoseyGellarani. He has cirrhotic liver, calcified gallstones and ascites. Portal vein or hepatic veins could not be seen because colon is interposed between the liver and abdominal wall. Patient given single dose of metolazone and if it works we'll give him 2 more doses during hospitalization. By mouth fluid restricted to 1500 mL/24 hr.

## 2013-05-23 NOTE — Progress Notes (Signed)
Patient seen, independently examined and chart reviewed. I agree with exam, assessment and plan discussed with Toya SmothersKaren Black, NP.  Started to have significant urine output. No significant change in lower body edema.  Remains afebrile with stable vital signs. Appears calm and comfortable. Massive edema of the lower extremities up to the abdominal wall without significant change. Urine output appears to be improving, -1 L since admission.  Basic metabolic panel unremarkable. Hemoglobin stable.  Discussed with Dr. Karilyn Cotaehman. We will continue treatment for anasarca Lasix and albumin. Insignificant ascites. Will likely require several days to achieve adequate diuresis. SCDs only given h/o large gastric ulcer.  Brendia Sacksaniel Goodrich, MD [facility] [phone]

## 2013-05-23 NOTE — Progress Notes (Signed)
Patient ID: Aaron Gordon, male   DOB: February 17, 1946, 68 y.o.   MRN: [id number] Alert. States he does not feel much better. He says he his SOB.  He continues to smoke 2-3 cigarettes a day. He has not drank ETOH since October. Hx of Hepatitis C. Assessment: 68 yr old male admitted with anasarca. Hx of ascites. Albumin low at 1.4 and Albumin 50gm IV x 3 has been ordered.  Lung; Bilateral wheezes. Heart rate is regular. Abdomen is distended but not tense. BS+. Edema to lower extremities. Edema to penis and scrotum. Foley cath in place.  CBC    Component Value Date/Time   WBC 9.4 05/23/2013 0455   WBC 5.1 04/29/2013 0946   WBC 7.8 03/12/2013 1312   RBC 3.28* 05/23/2013 0455   RBC 3.72* 04/29/2013 0946   RBC 4.0* 03/12/2013 1312   HGB 11.0* 05/23/2013 0455   HGB 12.1* 03/20/2013 1711   HCT 31.6* 05/23/2013 0455   HCT 41.6* 03/12/2013 1312   PLT 169 05/23/2013 0455   MCV 96.3 05/23/2013 0455   MCV 103.2* 03/12/2013 1312   MCH 33.5 05/23/2013 0455   MCH 33.3* 04/29/2013 0946   MCH 33.5* 03/12/2013 1312   MCHC 34.8 05/23/2013 0455   MCHC 34.9 04/29/2013 0946   MCHC 32.5 03/12/2013 1312   RDW 16.6* 05/23/2013 0455   RDW 14.6 04/29/2013 0946   LYMPHSABS 2.5 04/29/2013 0946   LYMPHSABS 3.3 03/22/2013 2355   MONOABS 0.6 03/22/2013 2355   EOSABS 0.3 04/29/2013 0946   EOSABS 0.2 03/22/2013 2355   BASOSABS 0.1 04/29/2013 0946   BASOSABS 0.1 03/22/2013 2355  Assessment: Anasarca. Hx of ascites and has been maintained on Lasix and spironolactone. Album low at 1.4. He has orders for Albumin 50gms x 3.  Will continue to monitor.

## 2013-05-23 NOTE — Progress Notes (Signed)
[facility] PROGRESS NOTE  Aaron Gordon:[id number] DOB: 02-Aug-1945 DOA: 05/22/2013 PCP: Rudi Heap, MD  Assessment/Plan: Anasarca: Likely related to liver disease and portal hypertension in setting of hypoalbuminemia concerning for progression of liver disease.  2decho 11/14 with preserved LV function and grade 1 diastolic dysfunction.  Appreciate GI assistance. Albumin 50 gm day #2/3. Continue Lasix IV and continue his home spironolactone. Review indicates his optimal dry weight is 180 pounds. Weight 97kg. strict intake and output.   Active Problems:  Ascites: amount insufficient for paracentesis. Mostly abdominal wall edema.  Continue spironolactone and Lasix as indicated above.   Hyponatremia: mild. Related to #1. Trending up slowly. Continue to follow.  Hepatitis C antibody test positive: GI note indicates inclination to offer treatment for hep C .  Erosive esophagitis/gastric ulcer: Patient diagnosed November 2014. He underwent a EGD 05/09/2013 to document resolution of gastric ulcer. Ulcerative esophagitis had completely healed there was one grade 1 esophageal varices. Will continue PPI   Liver cirrhosis: see above. Concern for worsening disease. appreciate Dr. Karilyn Cota assistance.   Diastolic dysfunction: Chart review indicates EF 60% and grade 1 diastolic dysfunction. Spironolactone and lasix as above. See problem #1   Tobacco abuse: offered cessation counseling   Alcohol abuse: Patient's last drink was in October 2014    Code Status: full Family Communication: none presendt Disposition Plan: home hopefully 24-48 hours   Consultants:  GI Dr Karilyn Cota  Procedures:  none  Antibiotics:  none  HPI/Subjective: Sitting up in bed reports not feeling much better. States "im still all swollen"  Objective: Filed Vitals:   05/23/13 0550  BP: 131/83  Pulse: 76  Temp: 97.5 F (36.4 C)  Resp: 20    Intake/Output Summary (Last 24 hours) at 05/23/13 1003 Last  data filed at 05/23/13 0900  Gross per 24 hour  Intake    600 ml  Output    800 ml  Net   -200 ml   Filed Weights   05/22/13 1257 05/22/13 1309 05/23/13 0550  Weight: 92.4 kg (203 lb 11.3 oz) 92.534 kg (204 lb) 97 kg (213 lb 13.5 oz)    Exam:   General:  Calm cooperative NAD  Cardiovascular: RRR No MGR 3+ LE edema up to thighs  Respiratory: normal effort BS slightly coarse faint diffuse wheeze no crackles  Abdomen: distended +BS non-tender to palpation  Musculoskeletal: no clubbing or cyanosis   Data Reviewed: Basic Metabolic Panel:  Recent Labs Lab 05/22/13 1420 05/23/13 0455  NA 135* 136*  K 4.3 3.9  CL 101 102  CO2 26 24  GLUCOSE 99 92  BUN 17 18  CREATININE 0.86 0.89  CALCIUM 8.0* 8.0*   Liver Function Tests:  Recent Labs Lab 05/22/13 1420  AST 74*  ALT 30  ALKPHOS 153*  BILITOT 0.8  PROT 6.8  ALBUMIN 1.4*   No results found for this basename: LIPASE, AMYLASE,  in the last 168 hours No results found for this basename: AMMONIA,  in the last 168 hours CBC:  Recent Labs Lab 05/22/13 1420 05/23/13 0455  WBC 7.8 9.4  HGB 11.4* 11.0*  HCT 32.3* 31.6*  MCV 96.4 96.3  PLT 193 169   Cardiac Enzymes: No results found for this basename: CKTOTAL, CKMB, CKMBINDEX, TROPONINI,  in the last 168 hours BNP (last 3 results) No results found for this basename: PROBNP,  in the last 8760 hours CBG: No results found for this basename: GLUCAP,  in the last 168 hours  No results  found for this or any previous visit (from the past 240 hour(s)).   Studies: [place] Abdomen Limited  05/22/2013   CLINICAL DATA:  Abdominal distension, evaluate for presence of ascites for possible paracentesis  EXAM: US ABDOMEN LIMITED - RIGHT UPPER QUADRANT  COMPARISON:  Prior CT, most recent exam 03/23/2013  FINDINGS: A small amount of ascites is noted in the 4 abdominal quadrants but insufficient quantity is present for paracentesis.  IMPRESSION: Insufficient quantity of small ascites,  paracentesis not performed.   Electronically Signed   By: Christiana PellantGretchen  Green M.D.   On: 05/22/2013 15:46    Scheduled Meds: . albumin human  50 g Intravenous Daily  . albuterol  2.5 mg Nebulization Q6H  . furosemide  40 mg Intravenous Q12H  . pantoprazole  40 mg Oral BID  . sodium chloride  3 mL Intravenous Q12H  . spironolactone  50 mg Oral BID  . tiotropium  18 mcg Inhalation Daily   Continuous Infusions:   Principal Problem:   Anasarca Active Problems:   Alcohol abuse   Liver cirrhosis   Erosive esophagitis   Portal hypertensive gastropathy   Ascites   Hepatitis C antibody test positive   Hypoalbuminemia   Tobacco abuse   Diastolic dysfunction    Time spent: 35 mintues    [facility] Len[facility] CareBLACK,Colleen Kotlarz M  [facility] Pager [phone]. If 7PM-7AM, please contact night-coverage at [email or web address], password [email or web address] 05/23/2013, 10:03 AM  LOS: 1 day

## 2013-05-23 NOTE — Progress Notes (Signed)
UR chart review completed.  

## 2013-05-23 NOTE — Care Management Note (Addendum)
    Page 1 of 1   05/30/2013     10:36:11 AM   CARE MANAGEMENT NOTE 05/30/2013  Patient:  Aaron Gordon,Aaron Gordon   Account Number:  [id number]  Date Initiated:  05/23/2013  Documentation initiated by:  Aaron RothmanBLACKWELL,Aaron Gordon  Subjective/Objective Assessment:   Pt admitted from home with cirrhosis and ascities. Pt lives with his wife and will return home at discharge. Pt requires some assistance with bathing and dressing from his wife.     Action/Plan:   No CM needs noted at this time.   Anticipated DC Date:  05/27/2013   Anticipated DC Plan:  HOME/SELF CARE      DC Planning Services  CM consult      Choice offered to / List presented to:             Status of service:  Completed, signed off Medicare Important Message given?  YES (If response is "NO", the following Medicare IM given date fields will be blank) Date Medicare IM given:  05/30/2013 Date Additional Medicare IM given:    Discharge Disposition:  HOME/SELF CARE  Per UR Regulation:    If discussed at Long Length of Stay Meetings, dates discussed:   05/28/2013  05/30/2013    Comments:  05/30/13 1030 Aaron Decuir, RN BSN CM Pt discharged home today. Pt has outpt PT appt information and script for cane. Pts wife stated that she has a cane from a neighbor and does not need new one at this time. No CM needs noted.  05/23/13 1130 Aaron Queenammy Omare Bilotta, RN BSN CM

## 2013-05-24 LAB — BASIC METABOLIC PANEL
BUN: 19 mg/dL (ref 6–23)
CHLORIDE: 102 meq/L (ref 96–112)
CO2: 24 mEq/L (ref 19–32)
CREATININE: 0.92 mg/dL (ref 0.50–1.35)
Calcium: 8.2 mg/dL — ABNORMAL LOW (ref 8.4–10.5)
GFR calc Af Amer: >90 mL/min (ref >90)
GFR calc non Af Amer: 85 mL/min — ABNORMAL LOW (ref >90)
Glucose, Bld: 98 mg/dL (ref 70–99)
Potassium: 4.2 mEq/L (ref 3.7–5.3)
Sodium: 135 mEq/L — ABNORMAL LOW (ref 137–147)

## 2013-05-24 LAB — GLUCOSE, CAPILLARY: Glucose-Capillary: 94 mg/dL (ref 70–99)

## 2013-05-24 MED ORDER — LORAZEPAM 1 MG PO TABS
1.0000 mg | ORAL_TABLET | Freq: Every day | ORAL | Status: DC
  Administered 2013-05-24 – 2013-05-29 (×6): 1 mg via ORAL
  Filled 2013-05-24 (×6): qty 1

## 2013-05-24 MED ORDER — METOLAZONE 5 MG PO TABS
5.0000 mg | ORAL_TABLET | Freq: Once | ORAL | Status: AC
  Administered 2013-05-24: 5 mg via ORAL
  Filled 2013-05-24: qty 1

## 2013-05-24 MED ORDER — SPIRONOLACTONE 25 MG PO TABS
100.0000 mg | ORAL_TABLET | Freq: Two times a day (BID) | ORAL | Status: DC
  Administered 2013-05-24 – 2013-05-25 (×3): 100 mg via ORAL
  Filled 2013-05-24 (×3): qty 4

## 2013-05-24 MED ORDER — LACTULOSE 10 GM/15ML PO SOLN
20.0000 g | Freq: Every day | ORAL | Status: DC
  Administered 2013-05-24: 20 g via ORAL
  Filled 2013-05-24: qty 30

## 2013-05-24 NOTE — Progress Notes (Signed)
[facility] PROGRESS NOTE  Aaron Gordon WUJ:[id number] DOB: 1945-11-23 DOA: 05/22/2013 PCP: Rudi Heap, MD  Assessment/Plan: Anasarca: Likely related to liver disease and portal hypertension in setting of hypoalbuminemia concerning for progression of liver disease. 2decho 11/14 with preserved LV function and grade 1 diastolic dysfunction. Appreciate GI assistance. Albumin 50 gm day #3/3. Zaroxolyn added yesterday with improved urine output. Continue Lasix IV and continue his home spironolactone. Chart review indicates his optimal dry weight is 180 pounds. Weight 96.6kg. Volume status -2.2L. Continue fluid restriction. continue strict intake and output.  Active Problems:  Ascites: amount insufficient for paracentesis. Mostly abdominal wall edema. Continue spironolactone and Lasix as indicated above.   Hyponatremia: mild and stable.  Related to #1. Continue to follow.   Hepatitis C antibody test positive: GI note indicates inclination to offer treatment for hep C .   Erosive esophagitis/gastric ulcer: Patient diagnosed November 2014. He underwent a EGD 05/09/2013 to document resolution of gastric ulcer. Ulcerative esophagitis had completely healed there was one grade 1 esophageal varices. Will continue PPI   Liver cirrhosis: see above. Concern for worsening disease. appreciate Dr. Karilyn Cota assistance.   Diastolic dysfunction: Chart review indicates EF 60% and grade 1 diastolic dysfunction. Spironolactone and lasix as above. See problem #1  Tobacco abuse: offered cessation counseling   Alcohol abuse: Patient's last drink was in October 2014    Code Status: full Family Communication: none present Disposition Plan: home when ready hopefully day or two   Consultants:  Dr. Karilyn Cota GI  Procedures:  none  Antibiotics:  none  HPI/Subjective: Sitting up eating breakfast. Reports feeling some better.   Objective: Filed Vitals:   05/24/13 0446  BP: 123/75  Pulse: 99  Temp:  98.6 F (37 C)  Resp: 20    Intake/Output Summary (Last 24 hours) at 05/24/13 0828 Last data filed at 05/24/13 0545  Gross per 24 hour  Intake    960 ml  Output   2900 ml  Net  -1940 ml   Filed Weights   05/23/13 0550 05/23/13 1100 05/24/13 0446  Weight: 97 kg (213 lb 13.5 oz) 93.759 kg (206 lb 11.2 oz) 96.6 kg (212 lb 15.4 oz)    Exam:   General:  Well nourished NAD  Cardiovascular: RRR No MGR Improved LE edema, 2+LE edema  Respiratory: normal effort BS somewhat coarse bilaterally faint wheeze  Abdomen: somewhat less distended. Soft and non-tender. +BS   Musculoskeletal: no clubbing or cyanosis   Data Reviewed: Basic Metabolic Panel:  Recent Labs Lab 05/22/13 1420 05/23/13 0455 05/24/13 0514  NA 135* 136* 135*  K 4.3 3.9 4.2  CL 101 102 102  CO2 26 24 24   GLUCOSE 99 92 98  BUN 17 18 19   CREATININE 0.86 0.89 0.92  CALCIUM 8.0* 8.0* 8.2*   Liver Function Tests:  Recent Labs Lab 05/22/13 1420  AST 74*  ALT 30  ALKPHOS 153*  BILITOT 0.8  PROT 6.8  ALBUMIN 1.4*   No results found for this basename: LIPASE, AMYLASE,  in the last 168 hours No results found for this basename: AMMONIA,  in the last 168 hours CBC:  Recent Labs Lab 05/22/13 1420 05/23/13 0455  WBC 7.8 9.4  HGB 11.4* 11.0*  HCT 32.3* 31.6*  MCV 96.4 96.3  PLT 193 169   Cardiac Enzymes: No results found for this basename: CKTOTAL, CKMB, CKMBINDEX, TROPONINI,  in the last 168 hours BNP (last 3 results) No results found for this basename: PROBNP,  in the  last 8760 hours CBG: No results found for this basename: GLUCAP,  in the last 168 hours  No results found for this or any previous visit (from the past 240 hour(s)).   Studies: [place] Abdomen Complete  05/23/2013   CLINICAL DATA:  Abdominal distention, history of decompensated cirrhosis, history of previous splenectomy  EXAM: ULTRASOUND ABDOMEN COMPLETE  COMPARISON:  None.  FINDINGS: Gallbladder:  The gallbladder could not be  visualized due to bowel gas.  Common bile duct:  The common bile duct could not be demonstrated due to bowel gas.  Liver:  The liver is small and incompletely evaluated due to bowel gas.  IVC:  No abnormality visualized due to bowel gas.  Pancreas:  The pancreas could not be demonstrated due to bowel gas  Spleen:  The spleen is surgically absent.  Right Kidney:  Length: 9.4 cm. Echogenicity is grossly normal. No hydronephrosis is demonstrated.  Left Kidney:  Length: 11.1 cm. Echogenicity is grossly normal. No hydronephrosis is demonstrated.  Abdominal aorta:  On in mid abdominal aorta could be demonstrated. It measured 2.4 cm in diameter.  Other findings:  There is a small amount of ascites adjacent to the kidneys. A large amount of bowel gas is present which limits this study.  IMPRESSION: This is a very limited study as noted above. The liver appears small. The gallbladder and common bile duct and pancreas could not be demonstrated. The spleen is surgically absent. The kidneys are grossly normal. Followup abdominal CT scanning would likely be the most useful imaging technique given the large amount of gas present.   Electronically Signed   By: David  [place]   On: 05/23/2013 15:31   [place] Abdomen Limited  05/22/2013   CLINICAL DATA:  Abdominal distension, evaluate for presence of ascites for possible paracentesis  EXAM: US ABDOMEN LIMITED - RIGHT UPPER QUADRANT  COMPARISON:  Prior CT, most recent exam 03/23/2013  FINDINGS: A small amount of ascites is noted in the 4 abdominal quadrants but insufficient quantity is present for paracentesis.  IMPRESSION: Insufficient quantity of small ascites, paracentesis not performed.   Electronically Signed   By: Christiana PellantGretchen  Green M.D.   On: 05/22/2013 15:46    Scheduled Meds: . albumin human  50 g Intravenous Daily  . albuterol  2.5 mg Nebulization Q6H  . furosemide  40 mg Intravenous Q12H  . metolazone  5 mg Oral Once  . pantoprazole  40 mg Oral BID  . sodium chloride  3  mL Intravenous Q12H  . spironolactone  100 mg Oral BID  . tiotropium  18 mcg Inhalation Daily   Continuous Infusions:   Principal Problem:   Anasarca Active Problems:   Alcohol abuse   Liver cirrhosis   Erosive esophagitis   Portal hypertensive gastropathy   Ascites   Hepatitis C antibody test positive   Hypoalbuminemia   Tobacco abuse   Diastolic dysfunction    Time spent: 35 minutes    [facility] HospitalBLACK,Lanier Millon M  [facility] Pager [phone]. If 7PM-7AM, please contact night-coverage at [email or web address], password [email or web address] 05/24/2013, 8:28 AM  LOS: 2 days

## 2013-05-24 NOTE — Progress Notes (Addendum)
Subjective;  Patient feels better. He is able to breathe better. He has noted decrease in scrotal edema and lower extremity edema. He has good appetite. His bowels did move today; he passed a hard stools. He has lost 5 pounds in the last 24 hours.  Objective;  BP 123/75  Pulse 99  Temp(Src) 98.6 F (37 C) (Oral)  Resp 20  Ht 5\' 8"  (1.727 m)  Wt 204 lb (92.534 kg)  BMI 31.03 kg/m2  SpO2 96% Patient is alert and does not have asterixis. Abdomen is full but soft and nontender. Scrotal size has decreased since yesterday his skin is not as tight. He has 2-3+ lower extremity edema.  Urine output in the last 24 hours 2900 mL  Lab data; Serum sodium 135, potassium 4.2, chloride 102, CO2 24, BUN 19, creatinine 0.92 and glucose 98  Assessment;  1. Fluid overload primarily related to cirrhosis and hypoalbuminemia. He certainly could have primary hypertension due to COPD conjugating to fluid overload. Echocardiography in November 2014 revealed grade 1 diastolic function ejection fraction was 60-65%. He is responding to combination of IV albumin IV furosemide and metolazone. #2. 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 My office has sent in written request for Harvoni. If this therapy is approved will start him soon. This treatment is not FDA approved for decompensated disease he does not have many choices. #3. Insomnia. #4. Anemia. H&H is stable. It is felt to be due to chronic disease. No evidence of active bleed  Recommendations;  Increase spironolactone to 100 mg po bid. Another dose of metolazone today. LFTs and metabolic 7 in am. Lorazepam 1 mg po qhs. Lactulose 30 g by mouth each bedtime. Metabolic 7 and LFTs in a.m. Need for further doses of IV albumin to be determined after evaluation in a.m.  Dr. Darrick PennaFields will be assisting you with the GI issues over the weekend.

## 2013-05-24 NOTE — Telephone Encounter (Signed)
A copy of a low salt diet has been sent to the patient. We have completed the paper work for PA for [employer].

## 2013-05-24 NOTE — Progress Notes (Signed)
Patient seen, independently examined and chart reviewed. I agree with exam, assessment and plan discussed with Toya SmothersKaren Black, NP.  He feels better. He is starting to put out significant fluid in the swelling is decreasing. He has no complaints at this point.  Afebrile, vital signs stable. Appears calm and comfortable. Speech fluent and clear. Cardiovascular regular rate and rhythm. Respiratory clear to auscultation bilaterally. No wheezes, rales or rhonchi. Normal respiratory effort. Slight decrease in massive bilateral lower extremity edema. Still has significant scrotal edema.  Weight appears unreliable. -3 liters since admission. -2900 mL last 24 hours. Basic metabolic panel unremarkable.   Anasarca, now responding diuretics and albumin as per gastroenterology.  Cirrhosis secondary to hepatitis C  Brendia Sacksaniel Goodrich, MD [facility] [phone]

## 2013-05-25 DIAGNOSIS — E678 Other specified hyperalimentation: Secondary | ICD-10-CM

## 2013-05-25 DIAGNOSIS — E8779 Other fluid overload: Secondary | ICD-10-CM

## 2013-05-25 LAB — HEPATIC FUNCTION PANEL
ALT: 20 U/L (ref 0–53)
AST: 53 U/L — ABNORMAL HIGH (ref 0–37)
Albumin: 2.3 g/dL — ABNORMAL LOW (ref 3.5–5.2)
Alkaline Phosphatase: 118 U/L — ABNORMAL HIGH (ref 39–117)
Bilirubin, Direct: 0.5 mg/dL — ABNORMAL HIGH (ref 0.0–0.3)
Indirect Bilirubin: 1.1 mg/dL — ABNORMAL HIGH (ref 0.3–0.9)
Total Bilirubin: 1.6 mg/dL — ABNORMAL HIGH (ref 0.3–1.2)
Total Protein: 6.5 g/dL (ref 6.0–8.3)

## 2013-05-25 LAB — BASIC METABOLIC PANEL
BUN: 19 mg/dL (ref 6–23)
CHLORIDE: 99 meq/L (ref 96–112)
CO2: 25 mEq/L (ref 19–32)
Calcium: 8.3 mg/dL — ABNORMAL LOW (ref 8.4–10.5)
Creatinine, Ser: 0.86 mg/dL (ref 0.50–1.35)
GFR calc Af Amer: >90 mL/min (ref >90)
GFR calc non Af Amer: 88 mL/min — ABNORMAL LOW (ref >90)
Glucose, Bld: 94 mg/dL (ref 70–99)
Potassium: 3.8 mEq/L (ref 3.7–5.3)
Sodium: 134 mEq/L — ABNORMAL LOW (ref 137–147)

## 2013-05-25 LAB — CBC
HEMATOCRIT: 28.6 % — AB (ref 39.0–52.0)
Hemoglobin: 10.2 g/dL — ABNORMAL LOW (ref 13.0–17.0)
MCH: 33.9 pg (ref 26.0–34.0)
MCHC: 35.7 g/dL (ref 30.0–36.0)
MCV: 95 fL (ref 78.0–100.0)
Platelets: 149 10*3/uL — ABNORMAL LOW (ref 150–400)
RBC: 3.01 MIL/uL — ABNORMAL LOW (ref 4.22–5.81)
RDW: 16.7 % — AB (ref 11.5–15.5)
WBC: 10.1 10*3/uL (ref 4.0–10.5)

## 2013-05-25 MED ORDER — PANTOPRAZOLE SODIUM 40 MG PO TBEC
40.0000 mg | DELAYED_RELEASE_TABLET | Freq: Two times a day (BID) | ORAL | Status: DC
  Administered 2013-05-25 – 2013-05-30 (×10): 40 mg via ORAL
  Filled 2013-05-25 (×10): qty 1

## 2013-05-25 MED ORDER — METOLAZONE 5 MG PO TABS
5.0000 mg | ORAL_TABLET | Freq: Once | ORAL | Status: AC
  Administered 2013-05-25: 5 mg via ORAL
  Filled 2013-05-25: qty 1

## 2013-05-25 MED ORDER — LACTULOSE 10 GM/15ML PO SOLN
20.0000 g | Freq: Every day | ORAL | Status: DC
  Administered 2013-05-25 – 2013-05-30 (×6): 20 g via ORAL
  Filled 2013-05-25 (×7): qty 30

## 2013-05-25 MED ORDER — ALBUMIN HUMAN 25 % IV SOLN
25.0000 g | Freq: Once | INTRAVENOUS | Status: AC
  Administered 2013-05-25: 25 g via INTRAVENOUS
  Filled 2013-05-25: qty 100

## 2013-05-25 MED ORDER — FUROSEMIDE 40 MG PO TABS
40.0000 mg | ORAL_TABLET | Freq: Two times a day (BID) | ORAL | Status: DC
  Administered 2013-05-25 – 2013-05-26 (×2): 40 mg via ORAL
  Filled 2013-05-25 (×3): qty 1

## 2013-05-25 MED ORDER — SPIRONOLACTONE 25 MG PO TABS
100.0000 mg | ORAL_TABLET | Freq: Two times a day (BID) | ORAL | Status: DC
  Administered 2013-05-25 – 2013-05-30 (×10): 100 mg via ORAL
  Filled 2013-05-25 (×10): qty 4

## 2013-05-25 NOTE — Progress Notes (Signed)
Subjective: PT ADMITTED WITH WEIGHT GAIN/FLUID RETENTION. WEIGHT AFTER METOLAZONE/ALBUMIN DOWN FROM 213 LBS TO 208 LBS. WEIGHED 180 LBS May 09, 2013. ON LOW NA/1.5L FLUID RESTRICTION. PT DENIES FEVER, CHILLS, BRBPR, nausea, vomiting, melena, abd pain, OR heartburn or indigestion.  Objective: Vital signs in last 24 hours: Temp:  [97.4 F (36.3 C)-98.7 F (37.1 C)] 98.7 F (37.1 C) (01/24 1314) Pulse Rate:  [79-89] 79 (01/24 1314) Resp:  [20] 20 (01/24 1314) BP: (117-121)/(67-77) 117/67 mmHg (01/24 1314) SpO2:  [93 %-97 %] 96 % (01/24 1314) Weight:  [208 lb 8.9 oz (94.6 kg)] 208 lb 8.9 oz (94.6 kg) (01/24 0557) Last BM Date: 05/25/13  Intake/Output from previous day: 01/23 0701 - 01/24 0700 In: 720 [P.O.:720] Out: 2925 [Urine:2925] Intake/Output this shift: Total I/O In: 630 [P.O.:630] Out: 1000 [Urine:1000]  General appearance: alert, cooperative and no distress Resp: clear to auscultation bilaterally. RECEIVING NEB. SITTING ON SIDE OF BED. Cardio: regular rate and rhythm GI: soft, non-tender; bowel sounds normal; no masses,  no organomegaly Extremities: edema 1-2+ BIL LE GROIN: UNCIRCUMCISED, SCROTAL EDEMA  Lab Results:  Recent Labs  05/22/13 1420 05/23/13 0455 05/25/13 0620  WBC 7.8 9.4 10.1  HGB 11.4* 11.0* 10.2*  HCT 32.3* 31.6* 28.6*  PLT 193 169 149*   BMET  Recent Labs  05/23/13 0455 05/24/13 0514 05/25/13 0620  NA 136* 135* 134*  K 3.9 4.2 3.8  CL 102 102 99  CO2 24 24 25   GLUCOSE 92 98 94  BUN 18 19 19   CREATININE 0.89 0.92 0.86  CALCIUM 8.0* 8.2* 8.3*   LFT  Recent Labs  05/25/13 0620  PROT 6.5  ALBUMIN 2.3*  AST 53*  ALT 20  ALKPHOS 118*  BILITOT 1.6*  BILIDIR 0.5*  IBILI 1.1*   PT/INR  Recent Labs  05/22/13 1420  LABPROT 15.6*  INR 1.27    Studies/Results: US Abdomen Complete  05/23/2013   CLINICAL DATA:  Abdominal distention, history of decompensated cirrhosis, history of previous splenectomy  EXAM: ULTRASOUND ABDOMEN  COMPLETE  COMPARISON:  None.  FINDINGS: Gallbladder:  The gallbladder could not be visualized due to bowel gas.  Common bile duct:  The common bile duct could not be demonstrated due to bowel gas.  Liver:  The liver is small and incompletely evaluated due to bowel gas.  IVC:  No abnormality visualized due to bowel gas.  Pancreas:  The pancreas could not be demonstrated due to bowel gas  Spleen:  The spleen is surgically absent.  Right Kidney:  Length: 9.4 cm. Echogenicity is grossly normal. No hydronephrosis is demonstrated.  Left Kidney:  Length: 11.1 cm. Echogenicity is grossly normal. No hydronephrosis is demonstrated.  Abdominal aorta:  On in mid abdominal aorta could be demonstrated. It measured 2.4 cm in diameter.  Other findings:  There is a small amount of ascites adjacent to the kidneys. A large amount of bowel gas is present which limits this study.  IMPRESSION: This is a very limited study as noted above. The liver appears small. The gallbladder and common bile duct and pancreas could not be demonstrated. The spleen is surgically absent. The kidneys are grossly normal. Followup abdominal CT scanning would likely be the most useful imaging technique given the large amount of gas present.   Electronically Signed   By: David  [place]   On: 05/23/2013 15:31    Medications: I have reviewed the patient's current medications.  Assessment/Plan: ADMITTED WITH FLUID RETENTION W/O ASCITES IN SETTING OF HYPOALBUMINEMIA. WEIGHT DOWN  WITH ALBUMIN/ZAROXYLYN.   PLAN:  1. REPEAT ZAROXLYN/ALBUMIN TODAY. 2. CONTINUE LASIX/ALDACTONE BID, & FLUID/NA RESTRICTION 3. D/C [place]. 4. RECHECK WEIGHT JAN 25.  LOS: 3 days   Sandi Fields 05/25/2013, 2:16 PM

## 2013-05-25 NOTE — Progress Notes (Signed)
[facility] PROGRESS NOTE  Midge MiniumJose L Hope ZOX:[id number] DOB: August 15, 1945 DOA: 05/22/2013 PCP: Rudi HeapMOORE, [name], MD  Summary: 68 year old man with history of hepatitis C, esophageal varices, cirrhosis, alcohol use, gastric ulcer who presented to his primary care physician's office with increasing edema. He was found to have anasarca and referred directly for admission. He is gradually improving with IV diuresis, followed by GI.  Assessment/Plan: 1. Anasarca, secondary to underlying liver disease, portal hypertension, hypoalbuminemia. Improving with diuretic regimen, albumin. 2. Cirrhosis secondary to hepatitis C, alcohol with grade 1 esophageal varices; management per gastroenterology. 3. History of erosive esophagitis, gastric ulcer 03/2013. 4. Normocytic anemia, close to baseline. Likely anemia of chronic disease. 5. History diastolic congestive heart failure. Stable. 6. Cigarette smoker. Recommend cessation.   Improving with excellent urine output and decreasing edema. Continue diuretics and albumin per gastroenterology.  Follow anemia. No evidence of bleeding.   Code Status: full code DVT prophylaxis: SCDs, h/o 03/2013 as above Family Communication: none present Disposition Plan: home when improved  Brendia Sacksaniel Dymond Spreen, MD  [facility]  Pager [phone] If 7PM-7AM, please contact night-coverage at [email or web address], password [email or web address] 05/25/2013, 9:58 AM  LOS: 3 days   Consultants:  Gastroenterology  Procedures:    Antibiotics:    HPI/Subjective: No issues overnight. No complaints. Swelling improving.  Objective: Filed Vitals:   05/24/13 2015 05/24/13 2242 05/25/13 0557 05/25/13 0721  BP:  119/70 121/77   Pulse:  89 85   Temp:  98.6 F (37 C) 97.4 F (36.3 C)   TempSrc:  Oral Oral   Resp:  20 20   Height:      Weight:   94.6 kg (208 lb 8.9 oz)   SpO2: 97% 94% 93% 93%    Intake/Output Summary (Last 24 hours) at 05/25/13 0958 Last data filed at 05/25/13 0919  Gross per 24 hour  Intake    880 ml  Output   3525 ml  Net  -2645 ml     Filed Weights   05/24/13 0446 05/24/13 0915 05/25/13 0557  Weight: 96.6 kg (212 lb 15.4 oz) 92.534 kg (204 lb) 94.6 kg (208 lb 8.9 oz)    Exam:   General: Appears calm and comfortable.  Afebrile, vital signs stable  Cardio vascular regular rate and rhythm. No murmur, rub or gallop.  Respiratory few wheezes, good air movement. No frank rhonchi or rales. Normal respiratory effort.   Still 3+ bilateral lower extremity edema but clearly improving.  Psychiatric grossly normal mood and affect. Speech fluent and appropriate.  Data Reviewed:  Basic metabolic panel unremarkable. Potassium normal.  Platelet 149, hemoglobin 10.2.  Was 2.2 L last 24 hours. -4.9 L since admission.  Weights are variable and do not correlate with UOP.  Scheduled Meds: . albuterol  2.5 mg Nebulization Q6H  . furosemide  40 mg Intravenous Q12H  . lactulose  20 g Oral QHS  . LORazepam  1 mg Oral QHS  . pantoprazole  40 mg Oral BID  . sodium chloride  3 mL Intravenous Q12H  . spironolactone  100 mg Oral BID  . tiotropium  18 mcg Inhalation Daily   Continuous Infusions:   Principal Problem:   Anasarca Active Problems:   Alcohol abuse   Liver cirrhosis   Erosive esophagitis   Portal hypertensive gastropathy   Ascites   Hepatitis C antibody test positive   Hypoalbuminemia   Tobacco abuse   Diastolic dysfunction   Time spent 20 minutes

## 2013-05-26 DIAGNOSIS — B1920 Unspecified viral hepatitis C without hepatic coma: Secondary | ICD-10-CM

## 2013-05-26 DIAGNOSIS — D649 Anemia, unspecified: Secondary | ICD-10-CM

## 2013-05-26 DIAGNOSIS — I519 Heart disease, unspecified: Secondary | ICD-10-CM

## 2013-05-26 DIAGNOSIS — F1020 Alcohol dependence, uncomplicated: Secondary | ICD-10-CM

## 2013-05-26 DIAGNOSIS — K7030 Alcoholic cirrhosis of liver without ascites: Secondary | ICD-10-CM

## 2013-05-26 LAB — BASIC METABOLIC PANEL
BUN: 18 mg/dL (ref 6–23)
CO2: 24 mEq/L (ref 19–32)
CREATININE: 0.83 mg/dL (ref 0.50–1.35)
Calcium: 8.2 mg/dL — ABNORMAL LOW (ref 8.4–10.5)
Chloride: 100 mEq/L (ref 96–112)
GFR calc non Af Amer: 89 mL/min — ABNORMAL LOW (ref >90)
Glucose, Bld: 91 mg/dL (ref 70–99)
Potassium: 3.6 mEq/L — ABNORMAL LOW (ref 3.7–5.3)
Sodium: 134 mEq/L — ABNORMAL LOW (ref 137–147)

## 2013-05-26 MED ORDER — METOLAZONE 5 MG PO TABS
5.0000 mg | ORAL_TABLET | Freq: Once | ORAL | Status: AC
  Administered 2013-05-26: 5 mg via ORAL
  Filled 2013-05-26: qty 1

## 2013-05-26 MED ORDER — ALBUMIN HUMAN 25 % IV SOLN
25.0000 g | Freq: Once | INTRAVENOUS | Status: AC
  Administered 2013-05-26: 25 g via INTRAVENOUS
  Filled 2013-05-26: qty 100

## 2013-05-26 MED ORDER — FUROSEMIDE 10 MG/ML IJ SOLN
40.0000 mg | Freq: Two times a day (BID) | INTRAMUSCULAR | Status: DC
  Administered 2013-05-26 – 2013-05-28 (×4): 40 mg via INTRAVENOUS
  Filled 2013-05-26 (×4): qty 4

## 2013-05-26 NOTE — Progress Notes (Signed)
Subjective: Since I last evaluated the patient SCROTAL EDEMA HAS IMPROVED. LOWER LEG EDEMA STILL PERSISTS. TOLERATING POS.  Objective: Vital signs in last 24 hours: Temp:  [97.8 F (36.6 C)-98.7 F (37.1 C)] 97.8 F (36.6 C) (01/25 0627) Pulse Rate:  [79-82] 82 (01/25 0627) Resp:  [20] 20 (01/25 0627) BP: (117-126)/(67-78) 120/78 mmHg (01/25 0627) SpO2:  [95 %-100 %] 95 % (01/25 0717) Weight:  [208 lb 15.9 oz (94.8 kg)] 208 lb 15.9 oz (94.8 kg) (01/25 0834) Last BM Date: 05/25/13  Intake/Output from previous day: 01/24 0701 - 01/25 0700 In: 1110 [P.O.:1110] Out: 1000 [Urine:1000] Intake/Output this shift: Total I/O In: 480 [P.O.:480] Out: -   General appearance: alert, cooperative and mild distress Resp: wheezes bilaterally Cardio: regular rate and rhythm GI: soft, non-tender; bowel sounds normal; no masses,  no organomegaly Extremities: edema 2-3+  Lab Results:  Recent Labs  05/25/13 0620  WBC 10.1  HGB 10.2*  HCT 28.6*  PLT 149*   BMET  Recent Labs  05/24/13 0514 05/25/13 0620 05/26/13 0557  NA 135* 134* 134*  K 4.2 3.8 3.6*  CL 102 99 100  CO2 24 25 24   GLUCOSE 98 94 91  BUN 19 19 18   CREATININE 0.92 0.86 0.83  CALCIUM 8.2* 8.3* 8.2*   LFT  Recent Labs  05/25/13 0620  PROT 6.5  ALBUMIN 2.3*  AST 53*  ALT 20  ALKPHOS 118*  BILITOT 1.6*  BILIDIR 0.5*  IBILI 1.1*    Studies/Results: No results found.  Medications: I have reviewed the patient's current medications.  Assessment/Plan: ADMITTED WITH FLUID RETENTION/HCV/ETOH CIRRHOSIS. CONTINUES TO DIURESE. CLINICALLY IMPROVED.  PLAN: 1. NEB ASAP. NURSING NOTIFIED. 2. REPEAT ALBUMIN/METOLAZONE TODAY 3. LASIX/ALDACTONE BID. MONITOR K. 4. WEIGHT PENDING.    LOS: 4 days   [facility] Jaicee Michelotti 05/26/2013, 11:51 AM

## 2013-05-26 NOTE — Progress Notes (Signed)
[facility] PROGRESS NOTE  Midge MiniumJose L Wivell NFA:[id number] DOB: 1945-05-25 DOA: 05/22/2013 PCP: Rudi HeapMOORE, [name], MD  Summary: 68 year old man with history of hepatitis C, esophageal varices, cirrhosis, alcohol use, gastric ulcer who presented to his primary care physician's office with increasing edema. He was found to have anasarca and referred directly for admission. He is gradually improving with IV diuresis, followed by GI.  Assessment/Plan: 1. Anasarca, secondary to underlying liver disease, portal hypertension, hypoalbuminemia. Slowly improving. 2. Cirrhosis secondary to hepatitis C, alcohol with grade 1 esophageal varices; management per gastroenterology. 3. History of erosive esophagitis, gastric ulcer 03/2013. 4. Normocytic anemia, close to baseline. Likely anemia of chronic disease. 5. History diastolic congestive heart failure. Stable. 6. Cigarette smoker. Recommend cessation.   Edema only slowly improving last 24 hours. Continue albumin, talus, Lasix, Aldactone per GI.  Code Status: full code DVT prophylaxis: SCDs, h/o 03/2013 as above Family Communication: none present Disposition Plan: home when improved  Brendia Sacksaniel Arvel Oquinn, MD  [facility]  Pager [phone] If 7PM-7AM, please contact night-coverage at [email or web address], password [email or web address] 05/26/2013, 6:22 PM  LOS: 4 days   Consultants:  Gastroenterology  Procedures:    Antibiotics:    HPI/Subjective: No new complaints. No shortness of breath. Reports not much change in edema.  Objective: Filed Vitals:   05/26/13 1327 05/26/13 1620 05/26/13 1630 05/26/13 1641  BP:  127/72 124/79 124/67  Pulse:  102 98 97  Temp:  98.6 F (37 C) 98 F (36.7 C) 98 F (36.7 C)  TempSrc:  Oral Oral Oral  Resp:      Height:      Weight:      SpO2: 95% 97% 100% 95%    Intake/Output Summary (Last 24 hours) at 05/26/13 1822 Last data filed at 05/26/13 1815  Gross per 24 hour  Intake   1260 ml  Output    200 ml  Net   1060  ml     Filed Weights   05/24/13 0915 05/25/13 0557 05/26/13 0834  Weight: 92.534 kg (204 lb) 94.6 kg (208 lb 8.9 oz) 94.8 kg (208 lb 15.9 oz)    Exam:   Afebrile, vital signs stable.  General: Appears comfortable, calm. Speech fluent and clear.  Cardiovascular: Regular rate and rhythm. No murmur, rub or gallop.  Respiratory: Few wheezes, improved breath sounds. No frank rhonchi or rales. Normal respiratory effort.  Mild decrease in bilateral lower extremity edema, remains 3+. No change in scrotal edema.  Data Reviewed:  Weights appear inaccurate  Urine output only partially recorded  Basic metabolic panel unremarkable.  Scheduled Meds: . albuterol  2.5 mg Nebulization Q6H  . furosemide  40 mg Oral BID  . lactulose  20 g Oral Q1200  . LORazepam  1 mg Oral QHS  . pantoprazole  40 mg Oral BID AC  . sodium chloride  3 mL Intravenous Q12H  . spironolactone  100 mg Oral BID  . tiotropium  18 mcg Inhalation Daily   Continuous Infusions:   Principal Problem:   Anasarca Active Problems:   Alcohol abuse   Liver cirrhosis   Erosive esophagitis   Portal hypertensive gastropathy   Ascites   Hepatitis C antibody test positive   Hypoalbuminemia   Tobacco abuse   Diastolic dysfunction   Time spent 20 minutes

## 2013-05-27 LAB — BASIC METABOLIC PANEL
BUN: 18 mg/dL (ref 6–23)
CALCIUM: 8.4 mg/dL (ref 8.4–10.5)
CO2: 25 meq/L (ref 19–32)
CREATININE: 0.82 mg/dL (ref 0.50–1.35)
Chloride: 103 mEq/L (ref 96–112)
GFR calc Af Amer: >90 mL/min (ref >90)
GFR calc non Af Amer: 89 mL/min — ABNORMAL LOW (ref >90)
Glucose, Bld: 94 mg/dL (ref 70–99)
Potassium: 3.8 mEq/L (ref 3.7–5.3)
Sodium: 137 mEq/L (ref 137–147)

## 2013-05-27 LAB — SODIUM, URINE, RANDOM: SODIUM UR: 79 meq/L

## 2013-05-27 MED ORDER — METOLAZONE 5 MG PO TABS
5.0000 mg | ORAL_TABLET | Freq: Once | ORAL | Status: AC
  Administered 2013-05-27: 5 mg via ORAL
  Filled 2013-05-27: qty 1

## 2013-05-27 NOTE — Progress Notes (Signed)
[facility] PROGRESS NOTE  Aaron Gordon WUJ:[id number] DOB: [birth year] DOA: 05/22/2013 PCP: Rudi HeapMOORE, [name], MD  Assessment/Plan:  Anasarca: Related to liver disease and portal hypertension in setting of hypoalbuminemia concerning for progression of liver disease. 2decho 11/14 with preserved LV function and grade 1 diastolic dysfunction.  Albumin 50 gm daily x5 days per GI. Zaroxolyn added 05/23/13. Volume status -3.7L. Weight 90.1kg. Continue Lasix IV and continue his home spironolactone. Chart review indicates his optimal dry weight is 180 pounds. Weight 96.6kg. Volume status -2.2L. Continue fluid restriction. continue strict intake and output.  Active Problems:   Ascites: amount insufficient for paracentesis. Mostly abdominal wall edema. Continue spironolactone and Lasix as indicated above.   Hyponatremia: Resolved. Related to #1. Continue to follow.   Hepatitis C antibody test positive: GI note indicates inclination to offer treatment for hep C . Defer to GI  Erosive esophagitis/gastric ulcer: Patient diagnosed November 2014. He underwent a EGD 05/09/2013 to document resolution of gastric ulcer. Ulcerative esophagitis had completely healed there was one grade 1 esophageal varices. Will continue PPI   Liver cirrhosis: see above. Concern for worsening disease. appreciate Dr. Karilyn Cotaehman assistance.   Diastolic dysfunction: Chart review indicates EF 60% and grade 1 diastolic dysfunction. Spironolactone and lasix as above. See problem #1   Normocytic anemia: likely related to chronic disease. Appears close to baseline. No s/sx active bleeding  Tobacco abuse: offered cessation counseling   Alcohol abuse: Patient's last drink was in October 2014    Code Status: full Family Communication: wife at bedside Disposition Plan: home when ready hopefully 24-48 hours   Consultants:  GI  Procedures:  none  Antibiotics:  none  HPI/Subjective: Standing at side of bed. Denies  pain/discomfort. Reports only slight improvement in edema  Objective: Filed Vitals:   05/27/13 0554  BP: 111/76  Pulse: 82  Temp: 98.5 F (36.9 C)  Resp:     Intake/Output Summary (Last 24 hours) at 05/27/13 0927 Last data filed at 05/27/13 0902  Gross per 24 hour  Intake   1143 ml  Output    900 ml  Net    243 ml   Filed Weights   05/26/13 2200 05/27/13 0630 05/27/13 0830  Weight: 92.5 kg (203 lb 14.8 oz) 92.2 kg (203 lb 4.2 oz) 90.175 kg (198 lb 12.8 oz)    Exam:   General:  Well nourished NAD  Cardiovascular: RRR no m/g/r 2+ LE edema  Respiratory: normal effort BS with diffuse coarseness and faint wheeze. Fair air movement  Abdomen: distended but soft +BS non-tender to palpation  Musculoskeletal:  No clubbing or cyanosis  Data Reviewed: Basic Metabolic Panel:  Recent Labs Lab 05/23/13 0455 05/24/13 0514 05/25/13 0620 05/26/13 0557 05/27/13 0514  NA 136* 135* 134* 134* 137  K 3.9 4.2 3.8 3.6* 3.8  CL 102 102 99 100 103  CO2 24 24 25 24 25   GLUCOSE 92 98 94 91 94  BUN 18 19 19 18 18   CREATININE 0.89 0.92 0.86 0.83 0.82  CALCIUM 8.0* 8.2* 8.3* 8.2* 8.4   Liver Function Tests:  Recent Labs Lab 05/22/13 1420 05/25/13 0620  AST 74* 53*  ALT 30 20  ALKPHOS 153* 118*  BILITOT 0.8 1.6*  PROT 6.8 6.5  ALBUMIN 1.4* 2.3*   No results found for this basename: LIPASE, AMYLASE,  in the last 168 hours No results found for this basename: AMMONIA,  in the last 168 hours CBC:  Recent Labs Lab 05/22/13 1420 05/23/13 0455 05/25/13 [id number]  WBC 7.8 9.4 10.1  HGB 11.4* 11.0* 10.2*  HCT 32.3* 31.6* 28.6*  MCV 96.4 96.3 95.0  PLT 193 169 149*   Cardiac Enzymes: No results found for this basename: CKTOTAL, CKMB, CKMBINDEX, TROPONINI,  in the last 168 hours BNP (last 3 results) No results found for this basename: PROBNP,  in the last 8760 hours CBG:  Recent Labs Lab 05/24/13 1712  GLUCAP 94    No results found for this or any previous visit (from  the past 240 hour(s)).   Studies: No results found.  Scheduled Meds: . albuterol  2.5 mg Nebulization Q6H  . furosemide  40 mg Intravenous Q12H  . lactulose  20 g Oral Q1200  . LORazepam  1 mg Oral QHS  . metolazone  5 mg Oral Once  . pantoprazole  40 mg Oral BID AC  . sodium chloride  3 mL Intravenous Q12H  . spironolactone  100 mg Oral BID  . tiotropium  18 mcg Inhalation Daily   Continuous Infusions:   Principal Problem:   Anasarca Active Problems:   Alcohol abuse   Liver cirrhosis   Erosive esophagitis   Portal hypertensive gastropathy   Ascites   Hepatitis C antibody test positive   Hypoalbuminemia   Tobacco abuse   Diastolic dysfunction    Time spent: 30 minutes    [facility] M  [facility] Pager [phone]. If 7PM-7AM, please contact night-coverage at [email or web address], password [facility] 05/27/2013, 9:27 AM  LOS: 5 days

## 2013-05-27 NOTE — Progress Notes (Signed)
Patient seen, independently examined and chart reviewed. I agree with exam, assessment and plan discussed with Toya SmothersKaren Black, NP.  Edema is slowly decreasing. He has noticed increased strength and ability to ambulate. No new issues.  He appears calm and comfortable. Cardiovascular regular rate and rhythm. Respiratory clear to auscultation bilaterally. No hypoxia. Mild decrease in bilateral lower extremity edema, not much change since yesterday. Scrotal edema without significant change since yesterday. Chemistry panel normal. Foley catheter was removed so it is difficult to strictly track output.  Continue plans for ongoing diuresis with Lasix, metolazone. No olive B. today. GI. Case discussed with Dr. Karilyn Cotaehman.  Brendia Sacksaniel Goodrich, MD [facility] [phone]

## 2013-05-27 NOTE — Progress Notes (Addendum)
Patient ID: [name], male   DOB: October 09, 1945, 68 y.o.   MRN: [id number] Sitting up eating breakfast. He is SOB. Continues to have edema to lower extremities and scrotum. 4+ edema. Foley cath out. Filed Vitals:   05/27/13 0554 05/27/13 0623 05/27/13 0630 05/27/13 0734  BP: 111/76     Pulse: 82     Temp: 98.5 F (36.9 C)     TempSrc: Oral     Resp:      Height:      Weight:   203 lb 4.2 oz (92.2 kg)   SpO2: 94% 95%  95%   Wt today standing 195.8. Urine out put today 500cc so far. Urine out put yesterday was 400cc. 1. Fluid overload primarily related to cirrhosis and hypoalbuminemia.   conjugating to fluid overload. Echocardiography in    Metolazone 5mg  today x 1. Urine NA.  #2. Cirrhosis secondary to chronic hepatitis C.Also past hx of ETOH use.  Will be treated with Harvoni once approved   GI attending note; Patient seen earlier today along with Ms. Dorene Arerri Setzer, NP. Patient does not have asterixis. Scrotal edema has decreased at least 50%. He weighs 195.8 pounds today. If weights are accurate he has lost 17 pounds since admission. We are checking urine sodium to make sure it's not low.

## 2013-05-28 LAB — BASIC METABOLIC PANEL
BUN: 23 mg/dL (ref 6–23)
CALCIUM: 8.5 mg/dL (ref 8.4–10.5)
CO2: 25 meq/L (ref 19–32)
CREATININE: 0.86 mg/dL (ref 0.50–1.35)
Chloride: 101 mEq/L (ref 96–112)
GFR calc Af Amer: >90 mL/min (ref >90)
GFR calc non Af Amer: 88 mL/min — ABNORMAL LOW (ref >90)
Glucose, Bld: 96 mg/dL (ref 70–99)
Potassium: 4 mEq/L (ref 3.7–5.3)
Sodium: 137 mEq/L (ref 137–147)

## 2013-05-28 MED ORDER — FUROSEMIDE 10 MG/ML IJ SOLN
40.0000 mg | Freq: Four times a day (QID) | INTRAMUSCULAR | Status: DC
  Administered 2013-05-28 – 2013-05-29 (×5): 40 mg via INTRAVENOUS
  Filled 2013-05-28 (×5): qty 4

## 2013-05-28 NOTE — Evaluation (Signed)
Physical Therapy Evaluation Patient Details Name: Aaron Gordon MRN: [id number] DOB: March 27, 1946 Today's Date: 05/28/2013 Time: 2094-7096 PT Time Calculation (min): 35 min  PT Assessment / Plan / Recommendation History of Present Illness  Pt is admitted with anasarca due to cirrhosis.  He has a hx of Hepatitis C and ETOH abuse along with other chronic problems.  He lives with his wife and states that he has been getting progressively weaker.  Clinical Impression   Pt was seen for evaluation.  He is found to be mildly deconditioned with decreased standing balance.  He does not need assist for transfers but is unstable with turns or narrow base of support.  He was instructed in the use of a cane for improved gait stability.  He would benefit from OP PT for emphasis on developing increased balance.  PT Assessment  All further PT needs can be met in the next venue of care    Follow Up Recommendations  Outpatient PT    Does the patient have the potential to tolerate intense rehabilitation      Barriers to Discharge        Equipment Recommendations  Cane;Other (comment) (tub transfer bench)    Recommendations for Other Services     Frequency      Precautions / Restrictions Precautions Precautions: Fall Precaution Comments: due to decreased standing balance Restrictions Weight Bearing Restrictions: No   Pertinent Vitals/Pain       Mobility  Bed Mobility Overal bed mobility: Modified Independent Transfers Overall transfer level: Modified independent Equipment used: None Ambulation/Gait Ambulation/Gait assistance: Supervision Ambulation Distance (Feet): 200 Feet Assistive device: None;Straight cane Gait Pattern/deviations: WFL(Within Functional Limits) Gait velocity interpretation: at or above normal speed for age/gender Stairs: Yes Stairs assistance: Modified independent (Device/Increase time) Stair Management: One rail Right Number of Stairs: 10    Exercises     PT  Diagnosis: Difficulty walking  PT Problem List: Decreased balance PT Treatment Interventions:       PT Goals(Current goals can be found in the care plan section) Acute Rehab PT Goals PT Goal Formulation: No goals set, d/c therapy  Visit Information  Last PT Received On: 05/28/13 History of Present Illness: Pt is admitted with anasarca due to cirrhosis.  He has a hx of Hepatitis C and ETOH abuse along with other chronic problems.  He lives with his wife and states that he has been getting progressively weaker.       Prior [place] expects to be discharged to:: Private residence Living Arrangements: Spouse/significant other Available Help at Discharge: Family Type of Home: Mobile home Home Access: Stairs to enter Technical brewer of Steps: 5 Entrance Stairs-Rails: Left Home Layout: One level Home Equipment: None Additional Comments: pt would benefit from having a cane and tub transfer bench at home Prior Function Level of Independence: Independent Communication Communication: HOH    Cognition  Cognition Arousal/Alertness: Awake/alert Behavior During Therapy: WFL for tasks assessed/performed Overall Cognitive Status: Within Functional Limits for tasks assessed    Extremity/Trunk Assessment Lower Extremity Assessment Lower Extremity Assessment: Overall WFL for tasks assessed   Balance Balance Overall balance assessment: Needs assistance Standing balance support: No upper extremity supported Standing balance-Leahy Scale: Fair Standing balance comment: Has instability with narrow base of support and during turns  End of Session PT - End of Session Equipment Utilized During Treatment: Gait belt Activity Tolerance: Patient tolerated treatment well Patient left: in chair;with call bell/phone within reach;with family/visitor present  GP  Demetrios Isaacs L 05/28/2013, 11:39 AM

## 2013-05-28 NOTE — Progress Notes (Addendum)
Patient ID: Aaron MiniumJose L Hilmer, male   DOB: August 04, 1945, 68 y.o.   MRN: [id number] Eating breakfast. SOB is somewhat better. Decrease in scrotal swelling. Continues to have 3-4+ edema to lower extremities. Weight 193 today.  Total urine output 700cc. Urine is clear in urinal this am. Recommendations: Increase lasix to 60mg  BID. (I discussed with Dr. Karilyn Cotaehman).    GI attending note; Patient has lost less than 2 pounds in the last 24 hours. Urinary sodium is 79 which is encouraging. Will check serum INR along with albumin a.m.  May consider another dose of IV albumin in a.m. if level less than 2.5.

## 2013-05-28 NOTE — Progress Notes (Signed)
Patient seen, independently examined and chart reviewed. I agree with exam, assessment and plan discussed with Toya SmothersKaren Black, NP.  He is starting to feel better now with decreasing edema. No new issues.  He appears calm and comfortable, vital signs are stable. There is no hypoxia. He appears to be done approximately 10 kg since admission although weights are somewhat variable. Chemistry panels unremarkable with preserved creatinine.  On exam he has clearly decreased bilateral lower extremity edema. He did well physical therapy.  Overall he is improving I think nearing the end of his hospital stay. Will adjust diuretics in hopes of achieving improved diuresis and would anticipate discharge within the next 48 hours.  Brendia Sacksaniel Goodrich, MD [facility] [phone]

## 2013-05-28 NOTE — Progress Notes (Signed)
[facility] PROGRESS NOTE  Aaron Gordon JWJ:[id number] DOB: [birth year] DOA: 05/22/2013 PCP: Aaron Gordon  Assessment/Plan: Anasarca: Related to liver disease and portal hypertension in setting of hypoalbuminemia concerning for progression of liver disease. 2decho 11/14 with preserved LV function and grade 1 diastolic dysfunction. Albumin 50 gm daily x5 days per GI. Zaroxolyn added 05/23/13. Volume status -3.7L. Weight 90.1kg. Continue Lasix IV and continue his home spironolactone. Chart review indicates his optimal dry weight is 180 pounds. Weight 87.9kg. Continue fluid restriction. continue strict intake and output. Increase lasix to every 6 hours.  Active Problems:  Ascites: amount insufficient for paracentesis. Mostly abdominal wall edema. Continue spironolactone and Lasix as indicated above.   Hyponatremia: Resolved. Related to #1. Continue to follow.   Hepatitis C antibody test positive: GI note indicates inclination to offer treatment for hep C . Defer to GI   Erosive esophagitis/gastric ulcer: Patient diagnosed November 2014. He underwent a EGD 05/09/2013 to document resolution of gastric ulcer. Ulcerative esophagitis had completely healed there was one grade 1 esophageal varices. Will continue PPI   Liver cirrhosis: see above. Concern for worsening disease. appreciate Aaron. Karilyn Gordon Gordon.   Diastolic dysfunction: Chart review indicates EF 60% and grade 1 diastolic dysfunction. Spironolactone and lasix as above. See problem #1  Normocytic anemia: likely related to chronic disease. Appears close to baseline. No s/sx active bleeding   Tobacco abuse: offered cessation counseling   Alcohol abuse: Patient's last drink was in October 2014    Code Status: full Family Communication: none present Disposition Plan: home when ready hopefully 24 hours or so   Consultants:  GI Aaron Gordon  Procedures:  none  Antibiotics: none HPI/Subjective: Sitting on side of bed.  Reports feeling somewhat better  Objective: Filed Vitals:   05/28/13 0709  BP: 106/72  Pulse: 93  Temp: 98 F (36.7 C)  Resp: 20    Intake/Output Summary (Last 24 hours) at 05/28/13 0944 Last data filed at 05/28/13 0847  Gross per 24 hour  Intake    720 ml  Output    800 ml  Net    -80 ml   Filed Weights   05/27/13 0630 05/27/13 0830 05/28/13 0709  Weight: 92.2 kg (203 lb 4.2 oz) 90.175 kg (198 lb 12.8 oz) 87.907 kg (193 lb 12.8 oz)    Exam:   General:  Well nourished NAD  Cardiovascular: RRR no MGR 2+LE edema  Respiratory: normal effort BS with faint wheeze that clears somewhat with cough. No crackles  Abdomen: distended but soft non-tender to palpation +BS  Musculoskeletal: no clubbing or cyanosis    Data Reviewed: Basic Metabolic Panel:  Recent Labs Lab 05/24/13 0514 05/25/13 0620 05/26/13 0557 05/27/13 0514 05/28/13 0527  NA 135* 134* 134* 137 137  K 4.2 3.8 3.6* 3.8 4.0  CL 102 99 100 103 101  CO2 24 25 24 25 25   GLUCOSE 98 94 91 94 96  BUN 19 19 18 18 23   CREATININE 0.92 0.86 0.83 0.82 0.86  CALCIUM 8.2* 8.3* 8.2* 8.4 8.5   Liver Function Tests:  Recent Labs Lab 05/22/13 1420 05/25/13 0620  AST 74* 53*  ALT 30 20  ALKPHOS 153* 118*  BILITOT 0.8 1.6*  PROT 6.8 6.5  ALBUMIN 1.4* 2.3*   No results found for this basename: LIPASE, AMYLASE,  in the last 168 hours No results found for this basename: AMMONIA,  in the last 168 hours CBC:  Recent Labs Lab 05/22/13 1420 05/23/13 0455 05/25/13 [id number]  WBC 7.8 9.4 10.1  HGB 11.4* 11.0* 10.2*  HCT 32.3* 31.6* 28.6*  MCV 96.4 96.3 95.0  PLT 193 169 149*   Cardiac Enzymes: No results found for this basename: CKTOTAL, CKMB, CKMBINDEX, TROPONINI,  in the last 168 hours BNP (last 3 results) No results found for this basename: PROBNP,  in the last 8760 hours CBG:  Recent Labs Lab 05/24/13 1712  GLUCAP 94    No results found for this or any previous visit (from the past 240 hour(s)).    Studies: No results found.  Scheduled Meds: . albuterol  2.5 mg Nebulization Q6H  . furosemide  40 mg Intravenous Q6H  . lactulose  20 g Oral Q1200  . LORazepam  1 mg Oral QHS  . pantoprazole  40 mg Oral BID AC  . sodium chloride  3 mL Intravenous Q12H  . spironolactone  100 mg Oral BID  . tiotropium  18 mcg Inhalation Daily   Continuous Infusions:   Principal Problem:   Anasarca Active Problems:   Alcohol abuse   Liver cirrhosis   Erosive esophagitis   Portal hypertensive gastropathy   Ascites   Hepatitis C antibody test positive   Hypoalbuminemia   Tobacco abuse   Diastolic dysfunction    Time spent: 30 mintues    [facility] M  [facility] Pager [phone]. If 7PM-7AM, please contact night-coverage at [email or web address], password [facility] 05/28/2013, 9:44 AM  LOS: 6 days

## 2013-05-29 DIAGNOSIS — R894 Abnormal immunological findings in specimens from other organs, systems and tissues: Secondary | ICD-10-CM

## 2013-05-29 LAB — PROTIME-INR
INR: 1.36 (ref 0.00–1.49)
PROTHROMBIN TIME: 16.4 s — AB (ref 11.6–15.2)

## 2013-05-29 LAB — URINALYSIS, ROUTINE W REFLEX MICROSCOPIC
BILIRUBIN URINE: NEGATIVE
GLUCOSE, UA: NEGATIVE mg/dL
KETONES UR: NEGATIVE mg/dL
LEUKOCYTES UA: NEGATIVE
Nitrite: NEGATIVE
PROTEIN: 30 mg/dL — AB
Specific Gravity, Urine: 1.01 (ref 1.005–1.030)
Urobilinogen, UA: 0.2 mg/dL (ref 0.0–1.0)
pH: 6 (ref 5.0–8.0)

## 2013-05-29 LAB — URINE MICROSCOPIC-ADD ON

## 2013-05-29 LAB — BASIC METABOLIC PANEL
BUN: 22 mg/dL (ref 6–23)
CALCIUM: 9 mg/dL (ref 8.4–10.5)
CO2: 28 mEq/L (ref 19–32)
CREATININE: 0.91 mg/dL (ref 0.50–1.35)
Chloride: 97 mEq/L (ref 96–112)
GFR calc Af Amer: >90 mL/min (ref >90)
GFR, EST NON AFRICAN AMERICAN: 86 mL/min — AB (ref >90)
GLUCOSE: 85 mg/dL (ref 70–99)
Potassium: 3.8 mEq/L (ref 3.7–5.3)
Sodium: 136 mEq/L — ABNORMAL LOW (ref 137–147)

## 2013-05-29 LAB — ALBUMIN: Albumin: 2.3 g/dL — ABNORMAL LOW (ref 3.5–5.2)

## 2013-05-29 LAB — CBC
HCT: 29.5 % — ABNORMAL LOW (ref 39.0–52.0)
Hemoglobin: 10.5 g/dL — ABNORMAL LOW (ref 13.0–17.0)
MCH: 33.7 pg (ref 26.0–34.0)
MCHC: 35.6 g/dL (ref 30.0–36.0)
MCV: 94.6 fL (ref 78.0–100.0)
PLATELETS: 180 10*3/uL (ref 150–400)
RBC: 3.12 MIL/uL — AB (ref 4.22–5.81)
RDW: 16.9 % — AB (ref 11.5–15.5)
WBC: 11.3 10*3/uL — ABNORMAL HIGH (ref 4.0–10.5)

## 2013-05-29 MED ORDER — AZITHROMYCIN 250 MG PO TABS
500.0000 mg | ORAL_TABLET | Freq: Every day | ORAL | Status: AC
  Administered 2013-05-29: 500 mg via ORAL
  Filled 2013-05-29: qty 2

## 2013-05-29 MED ORDER — ALBUMIN HUMAN 25 % IV SOLN
50.0000 g | Freq: Once | INTRAVENOUS | Status: AC
  Administered 2013-05-29: 50 g via INTRAVENOUS
  Filled 2013-05-29: qty 200

## 2013-05-29 MED ORDER — FUROSEMIDE 10 MG/ML IJ SOLN
60.0000 mg | Freq: Two times a day (BID) | INTRAMUSCULAR | Status: DC
  Administered 2013-05-29 – 2013-05-30 (×2): 60 mg via INTRAVENOUS
  Filled 2013-05-29 (×2): qty 6

## 2013-05-29 MED ORDER — AZITHROMYCIN 250 MG PO TABS
250.0000 mg | ORAL_TABLET | Freq: Every day | ORAL | Status: DC
  Administered 2013-05-30: 250 mg via ORAL
  Filled 2013-05-29: qty 1

## 2013-05-29 NOTE — Progress Notes (Signed)
The patient was seen and examined. He was discussed with nurse practitioner, Ms. Vedia CofferBlack. Agree with her assessment and plan. On exam, the patient has a few wheezes anteriorly. His heart reveals S1, S2, with a soft systolic murmur. His abdomen is soft without appreciable ascites. His lower extremities reveals 2-3+ pitting edema bilaterally. Neurologic: He is alert and oriented x3. We'll continue management as ordered per discussion with gastroenterologist, Dr. Karilyn Cotaehman. He will receive a total of 5 days of albumin infusion coupled with Zaroxolyn, IV Lasix, and spironolactone. The patient was advised to stop smoking permanently, as it is likely that he may be developing chronic bronchitis. We'll continue bronchodilators via nebulizers and add a Z-Pak empirically for mild acute bronchitis.

## 2013-05-29 NOTE — Progress Notes (Signed)
[facility] PROGRESS NOTE  Aaron Gordon ZOX:[id number] DOB: March 02, 1946 DOA: 05/22/2013 PCP: Rudi Heap, MD  Assessment/Plan: Anasarca: Related to liver disease and portal hypertension in setting of hypoalbuminemia concerning for progression of liver disease. 2decho 11/14 with preserved LV function and grade 1 diastolic dysfunction. Albumin 50 gm daily x5 days and again on 05/29/13 per GI. Zaroxolyn added 05/23/13. Volume status -5.9L.  Continue Lasix IV  and continue his home spironolactone. Chart review indicates his optimal dry weight is 180 pounds. Weight 87.9kg. Continue fluid restriction. continue strict intake and output. Lasix changed to 60mg  every 12 hours.  Active Problems:  Ascites: amount insufficient for paracentesis. Mostly abdominal wall edema. Continue spironolactone and Lasix as indicated above.   Hyponatremia: Mild. Related to #1 and diuresis. Continue to follow.   Hepatitis C antibody test positive: GI note indicates inclination to offer treatment for hep C . Defer to GI   Erosive esophagitis/gastric ulcer: Patient diagnosed November 2014. He underwent a EGD 05/09/2013 to document resolution of gastric ulcer. Ulcerative esophagitis had completely healed there was one grade 1 esophageal varices. Will continue PPI   Liver cirrhosis: see above. Concern for worsening disease. appreciate Dr. Karilyn Cota assistance.   Diastolic dysfunction: Chart review indicates EF 60% and grade 1 diastolic dysfunction. Spironolactone and lasix as above. See problem #1   Normocytic anemia: likely related to chronic disease. Appears close to baseline. No s/sx active bleeding   Leukocytosis: afebrile, non-toxic appearing. Will provide incentive spirometry. Check urinalysis. monitor  Tobacco abuse: offered cessation counseling   Alcohol abuse: Patient's last drink was in October 2014    Code Status: full Family Communication: family at bedside Disposition Plan: home likely  tomorrow   Consultants:  GI Dr Karilyn Cota  Procedures:  none  Antibiotics:  none  HPI/Subjective: Sitting on side of bed. Reports feeling better.   Objective: Filed Vitals:   05/29/13 0438  BP: 117/71  Pulse: 85  Temp: 97.8 F (36.6 C)  Resp: 20    Intake/Output Summary (Last 24 hours) at 05/29/13 1218 Last data filed at 05/29/13 0856  Gross per 24 hour  Intake   1240 ml  Output   3100 ml  Net  -1860 ml   Filed Weights   05/27/13 0830 05/28/13 0709 05/29/13 0500  Weight: 90.175 kg (198 lb 12.8 oz) 87.907 kg (193 lb 12.8 oz) 87.7 kg (193 lb 5.5 oz)    Exam:   General:  Well nourished in NAD  Cardiovascular: S1 and S2. i hear no murmur gallup or rub. 2+LE edema. Scrotal edema much improved  Respiratory: normal effort. BS slightly coarse diffusely. Faint expiratory wheeze . i hear no crackles  Abdomen: somewhat distended but soft. There is no tenderness to palpation. +BS in all 4 quadrants  Musculoskeletal: fair muscle tone. Joints without swelling or erythema.    Data Reviewed: Basic Metabolic Panel:  Recent Labs Lab 05/25/13 0620 05/26/13 0557 05/27/13 0514 05/28/13 0527 05/29/13 0523  NA 134* 134* 137 137 136*  K 3.8 3.6* 3.8 4.0 3.8  CL 99 100 103 101 97  CO2 25 24 25 25 28   GLUCOSE 94 91 94 96 85  BUN 19 18 18 23 22   CREATININE 0.86 0.83 0.82 0.86 0.91  CALCIUM 8.3* 8.2* 8.4 8.5 9.0   Liver Function Tests:  Recent Labs Lab 05/22/13 1420 05/25/13 0620 05/29/13 0523  AST 74* 53*  --   ALT 30 20  --   ALKPHOS 153* 118*  --   BILITOT  0.8 1.6*  --   PROT 6.8 6.5  --   ALBUMIN 1.4* 2.3* 2.3*   No results found for this basename: LIPASE, AMYLASE,  in the last 168 hours No results found for this basename: AMMONIA,  in the last 168 hours CBC:  Recent Labs Lab 05/22/13 1420 05/23/13 0455 05/25/13 0620 05/29/13 0523  WBC 7.8 9.4 10.1 11.3*  HGB 11.4* 11.0* 10.2* 10.5*  HCT 32.3* 31.6* 28.6* 29.5*  MCV 96.4 96.3 95.0 94.6  PLT 193  169 149* 180   Cardiac Enzymes: No results found for this basename: CKTOTAL, CKMB, CKMBINDEX, TROPONINI,  in the last 168 hours BNP (last 3 results) No results found for this basename: PROBNP,  in the last 8760 hours CBG:  Recent Labs Lab 05/24/13 1712  GLUCAP 94    No results found for this or any previous visit (from the past 240 hour(s)).   Studies: No results found.  Scheduled Meds: . albuterol  2.5 mg Nebulization Q6H  . furosemide  60 mg Intravenous Q12H  . lactulose  20 g Oral Q1200  . LORazepam  1 mg Oral QHS  . pantoprazole  40 mg Oral BID AC  . sodium chloride  3 mL Intravenous Q12H  . spironolactone  100 mg Oral BID  . tiotropium  18 mcg Inhalation Daily   Continuous Infusions:   Principal Problem:   Anasarca Active Problems:   Alcohol abuse   Liver cirrhosis   Erosive esophagitis   Portal hypertensive gastropathy   Ascites   Hepatitis C antibody test positive   Hypoalbuminemia   Tobacco abuse   Diastolic dysfunction    Time spent: 30 minutes    [facility] Children'[facility] HospitalBLACK,Lynel Forester M  [facility] Pager [phone]. If 7PM-7AM, please contact night-coverage at [email or web address], password [email or web address] 05/29/2013, 12:18 PM  LOS: 7 days

## 2013-05-29 NOTE — Progress Notes (Signed)
Patient ID: Aaron MiniumJose L Gordon, male   DOB: 07/14/1945, 68 y.o.   MRN: [id number] Feels better. States his breathing is better. Output increased. 3700cc. Urine clear in urinal. Less scrotal edema than yesterday. Weight the same, but was weighed while in bed. Will have RN repeat the weight. Filed Vitals:   05/28/13 2003 05/29/13 0438 05/29/13 0500 05/29/13 0712  BP: 129/79 117/71    Pulse: 95 85    Temp: 98.2 F (36.8 C) 97.8 F (36.6 C)    TempSrc: Oral Oral    Resp: 20 20    Height:      Weight:   193 lb 5.5 oz (87.7 kg)   SpO2: 97% 95%  95%   BMET    Component Value Date/Time   NA 136* 05/29/2013 0523   NA 137 04/29/2013 0946   K 3.8 05/29/2013 0523   CL 97 05/29/2013 0523   CO2 28 05/29/2013 0523   GLUCOSE 85 05/29/2013 0523   GLUCOSE 97 04/29/2013 0946   BUN 22 05/29/2013 0523   BUN 11 04/29/2013 0946   CREATININE 0.91 05/29/2013 0523   CALCIUM 9.0 05/29/2013 0523   GFRNONAA 86* 05/29/2013 0523   GFRAA >90 05/29/2013 0523    Alert. Skin warm and dry. Lungs are clear. HR is regular. Abdomen is soft. BS+.  75% decrease in edema to his scrotum.  Continues to have 3-4+ edema to lower extremities. Plan: Agree with Albumin 50gm IV. Will have RN re-weigh patient.

## 2013-05-30 DIAGNOSIS — J209 Acute bronchitis, unspecified: Secondary | ICD-10-CM | POA: Diagnosis not present

## 2013-05-30 LAB — BASIC METABOLIC PANEL
BUN: 24 mg/dL — AB (ref 6–23)
CHLORIDE: 97 meq/L (ref 96–112)
CO2: 27 mEq/L (ref 19–32)
Calcium: 8.5 mg/dL (ref 8.4–10.5)
Creatinine, Ser: 0.94 mg/dL (ref 0.50–1.35)
GFR calc Af Amer: >90 mL/min (ref >90)
GFR calc non Af Amer: 85 mL/min — ABNORMAL LOW (ref >90)
GLUCOSE: 89 mg/dL (ref 70–99)
Potassium: 3.7 mEq/L (ref 3.7–5.3)
Sodium: 136 mEq/L — ABNORMAL LOW (ref 137–147)

## 2013-05-30 LAB — CBC
HEMATOCRIT: 26.5 % — AB (ref 39.0–52.0)
Hemoglobin: 9.4 g/dL — ABNORMAL LOW (ref 13.0–17.0)
MCH: 33.6 pg (ref 26.0–34.0)
MCHC: 35.5 g/dL (ref 30.0–36.0)
MCV: 94.6 fL (ref 78.0–100.0)
Platelets: 162 10*3/uL (ref 150–400)
RBC: 2.8 MIL/uL — ABNORMAL LOW (ref 4.22–5.81)
RDW: 17 % — ABNORMAL HIGH (ref 11.5–15.5)
WBC: 10.6 10*3/uL — ABNORMAL HIGH (ref 4.0–10.5)

## 2013-05-30 MED ORDER — SPIRONOLACTONE 100 MG PO TABS: 100.0000 mg | ORAL_TABLET | Freq: Two times a day (BID) | ORAL | Status: DC

## 2013-05-30 MED ORDER — HYDROCODONE-ACETAMINOPHEN 5-325 MG PO TABS: 1.0000 | ORAL_TABLET | ORAL | Status: DC | PRN

## 2013-05-30 MED ORDER — LACTULOSE 10 GM/15ML PO SOLN: 20.0000 g | Freq: Every day | ORAL | Status: DC

## 2013-05-30 MED ORDER — AZITHROMYCIN 250 MG PO TABS: ORAL_TABLET | ORAL | Status: DC

## 2013-05-30 MED ORDER — TIOTROPIUM BROMIDE MONOHYDRATE 18 MCG IN CAPS: 18.0000 ug | ORAL_CAPSULE | Freq: Every day | RESPIRATORY_TRACT | Status: AC

## 2013-05-30 MED ORDER — FUROSEMIDE 20 MG PO TABS: 60.0000 mg | ORAL_TABLET | Freq: Every day | ORAL | Status: DC

## 2013-05-30 MED ORDER — LORAZEPAM 1 MG PO TABS: 1.0000 mg | ORAL_TABLET | Freq: Every day | ORAL | Status: DC

## 2013-05-30 NOTE — Progress Notes (Signed)
Patient being d/c home with prescriptions. IV cath removed and intact. No pain/swelling at site. Awaiting transport home.

## 2013-05-30 NOTE — Discharge Summary (Signed)
Physician Discharge Summary  Aaron Gordon:[id number] DOB: [birth year] DOA: 05/22/2013  PCP: Rudi Heap, MD  Admit date: 05/22/2013 Discharge date: 05/30/2013  Time spent: 40 minutes  Recommendations for Outpatient Follow-up:   Discharge Diagnoses:    Anasarca Diastolic dysfunction   Liver cirrhosis   Erosive esophagitis   Portal hypertensive gastropathy   Ascites   Hepatitis C antibody test positive   Hypoalbuminemia   Tobacco abuse   Alcohol abuse  Discharge Condition: stable  Diet recommendation: no salt heart healthy  Filed Weights   05/28/13 0709 05/29/13 0500 05/30/13 0422  Weight: 87.907 kg (193 lb 12.8 oz) 87.7 kg (193 lb 5.5 oz) 86.7 kg (191 lb 2.2 oz)    History of present illness:   Pleasant 68 year old man with history of hepatitis C, esophageal varices, cirrhosis, alcohol use, gastric ulcer who presented to his primary care physician's office today with increasing edema. He was found to have anasarca and referred directly for admission.  Aaron Gordon is a 68 y.o. male with a past medical history that includes gastric ulcer status post EGD, cirrhosis, hepatitis C, esophageal varices, portal hypertension H. pylori infection, diastolic dysfunction  Sent to ED on 05/22/13 from his primary care office with the chief complaint of worsening lower extremity edema. Patient had a followup endoscopy on January 8 that showed a healed gastric ulcer with 1 esophageal varices. He reported that since that procedure his lower extremity edema had worsened. Chart review indicated that at that time his Lasix was increased to 80 mg daily. Associated symptoms include bilateral leg pain particularly with walking, worsening abdominal distention, weight gain, increased wheezing and worsening shortness of breath. He denied any chest pain palpitations. He denied any abdominal pain, nausea vomiting. He denied diarrhea constipation melena or bright red blood per rectum. He reported compliance  with his medications. He denied NSAIDS. He reported his last drink was in October of 2014. Primary care provider's note indicates a 24 pound weight gain since 04/29/2013.    Hospital Course:  Anasarca: Related to liver disease and portal hypertension in setting of hypoalbuminemia concerning for progression of liver disease. 2decho 11/14 with preserved LV function and grade 1 diastolic dysfunction. Provided with IV lasix and  Albumin 50 gm daily x5 days and again on 05/29/13 per GI. Zaroxolyn added 05/23/13. Volume status -7.2L on discharge.  Chart review indicates his optimal dry weight is 180 pounds. Weight 86.7kg. He was also placed on fluid restriction fluid restriction which will continue at discharge. Will follow up with PCP 06/03/13. Recommend BMET for evaluation electrolytes. Follow up with Dr. Karilyn Cota in 3-4 weeks.    Ascites: amount insufficient for paracentesis. Mostly abdominal wall edema. Spironolactone and Lasix as indicated above. Much improved at discharge.   Hyponatremia: Mild. Related to #1 and diuresis. Appointment with PCP 06/03/13 for BMET to track.   Hepatitis C antibody test positive: GI note indicates inclination to offer treatment for hep C . Follow up with Dr. Karilyn Cota 3-4 weeks  Erosive esophagitis/gastric ulcer: Patient diagnosed November 2014. He underwent a EGD 05/09/2013 to document resolution of gastric ulcer.  Ulcerative esophagitis had completely healed there was one grade 1 esophageal varices. Will continue PPI   Liver cirrhosis: see above. Dr. Karilyn Cota assistance to follow   Diastolic dysfunction: Chart review indicates EF 60% and grade 1 diastolic dysfunction. Spironolactone and lasix as above. See problem #1   Normocytic anemia: likely related to chronic disease. Appears close to baseline. No s/sx active bleeding  Leukocytosis: afebrile, non-toxic appearing. Trending downward at discharge. May be related to mild acute bronchitis. Continue Z-Pak. Follow up OP with PCP  06/03/13  Bronchitis: mild. See above   Tobacco abuse: offered cessation counseling   Alcohol abuse: Patient's last drink was in October 2014       Procedures: none Consultations:  Dr Karilyn Cotaehman GI  Discharge Exam: Filed Vitals:   05/30/13 0515  BP: 109/68  Pulse:   Temp:   Resp:     General: well nourished NAD Cardiovascular: S1 and S2. +murmur. i hear no gallup or rub.  2+LE edema Respiratory: normal effort BS diffusely coarse with faint expiratory wheeze.   Discharge Instructions   Future Appointments Provider Department Dept Phone   06/04/2013 1:45 PM [place] [address] [place] [place] [facility] [phone]   08/05/2013 10:30 AM Malissa HippoNajeeb U Rehman, MD [facility] [phone]       Medication List         albuterol 108 (90 BASE) MCG/ACT inhaler  Commonly known as:  PROVENTIL HFA;VENTOLIN HFA  Inhale 2 puffs into the lungs every 6 (six) hours as needed for wheezing or shortness of breath.     azithromycin 250 MG tablet  Commonly known as:  ZITHROMAX  Take 1 tab daily for 3 days     CENTRUM SILVER ADULT 50+ PO  Take 1 tablet by mouth daily.     furosemide 20 MG tablet  Commonly known as:  LASIX  Take 3 tablets (60 mg total) by mouth daily.     HYDROcodone-acetaminophen 5-325 MG per tablet  Commonly known as:  NORCO/VICODIN  Take 1-2 tablets by mouth every 4 (four) hours as needed for moderate pain.     lactulose 10 GM/15ML solution  Commonly known as:  CHRONULAC  Take 30 mLs (20 g total) by mouth daily at 12 noon.     LORazepam 1 MG tablet  Commonly known as:  ATIVAN  Take 1 tablet (1 mg total) by mouth at bedtime.     omeprazole 40 MG capsule  Commonly known as:  PRILOSEC  Take 1 capsule (40 mg total) by mouth daily.     spironolactone 100 MG tablet  Commonly known as:  ALDACTONE  Take 1 tablet (100 mg total) by mouth 2 (two) times daily.     tiotropium 18 MCG inhalation capsule  Commonly known as:  SPIRIVA   Place 1 capsule (18 mcg total) into inhaler and inhale daily.       No Known Allergies     Follow-up Information   Follow up with Rudi HeapMOORE, [name], MD. Schedule an appointment as soon as possible for a visit in 1 week. (will neen BMET to evaluate electrolytes)    Specialty:  Family Medicine   Contact information:   [address] [place] [address] [phone]       Follow up with REHMAN,NAJEEB U, MD. Schedule an appointment as soon as possible for a visit in 4 weeks. (call for appointment.)    Specialty:  Gastroenterology   Contact information:   [address], [address] [place] [address] [phone]        The results of significant diagnostics from this hospitalization (including imaging, microbiology, ancillary and laboratory) are listed below for reference.    Significant Diagnostic Studies: [place] Abdomen Complete  05/23/2013   CLINICAL DATA:  Abdominal distention, history of decompensated cirrhosis, history of previous splenectomy  EXAM: ULTRASOUND ABDOMEN COMPLETE  COMPARISON:  None.  FINDINGS:  Gallbladder:  The gallbladder could not be visualized due to bowel gas.  Common bile duct:  The common bile duct could not be demonstrated due to bowel gas.  Liver:  The liver is small and incompletely evaluated due to bowel gas.  IVC:  No abnormality visualized due to bowel gas.  Pancreas:  The pancreas could not be demonstrated due to bowel gas  Spleen:  The spleen is surgically absent.  Right Kidney:  Length: 9.4 cm. Echogenicity is grossly normal. No hydronephrosis is demonstrated.  Left Kidney:  Length: 11.1 cm. Echogenicity is grossly normal. No hydronephrosis is demonstrated.  Abdominal aorta:  On in mid abdominal aorta could be demonstrated. It measured 2.4 cm in diameter.  Other findings:  There is a small amount of ascites adjacent to the kidneys. A large amount of bowel gas is present which limits this study.  IMPRESSION: This is a very limited study as noted above. The  liver appears small. The gallbladder and common bile duct and pancreas could not be demonstrated. The spleen is surgically absent. The kidneys are grossly normal. Followup abdominal CT scanning would likely be the most useful imaging technique given the large amount of gas present.   Electronically Signed   By: David  [place]   On: 05/23/2013 15:31   US Abdomen Limited  05/22/2013   CLINICAL DATA:  Abdominal distension, evaluate for presence of ascites for possible paracentesis  EXAM: US ABDOMEN LIMITED - RIGHT UPPER QUADRANT  COMPARISON:  Prior CT, most recent exam 03/23/2013  FINDINGS: A small amount of ascites is noted in the 4 abdominal quadrants but insufficient quantity is present for paracentesis.  IMPRESSION: Insufficient quantity of small ascites, paracentesis not performed.   Electronically Signed   By: Christiana Pellant M.D.   On: 05/22/2013 15:46    Microbiology: No results found for this or any previous visit (from the past 240 hour(s)).   Labs: Basic Metabolic Panel:  Recent Labs Lab 05/26/13 0557 05/27/13 0514 05/28/13 0527 05/29/13 0523 05/30/13 0516  NA 134* 137 137 136* 136*  K 3.6* 3.8 4.0 3.8 3.7  CL 100 103 101 97 97  CO2 24 25 25 28 27   GLUCOSE 91 94 96 85 89  BUN 18 18 23 22  24*  CREATININE 0.83 0.82 0.86 0.91 0.94  CALCIUM 8.2* 8.4 8.5 9.0 8.5   Liver Function Tests:  Recent Labs Lab 05/25/13 0620 05/29/13 0523  AST 53*  --   ALT 20  --   ALKPHOS 118*  --   BILITOT 1.6*  --   PROT 6.5  --   ALBUMIN 2.3* 2.3*   No results found for this basename: LIPASE, AMYLASE,  in the last 168 hours No results found for this basename: AMMONIA,  in the last 168 hours CBC:  Recent Labs Lab 05/25/13 0620 05/29/13 0523 05/30/13 0516  WBC 10.1 11.3* 10.6*  HGB 10.2* 10.5* 9.4*  HCT 28.6* 29.5* 26.5*  MCV 95.0 94.6 94.6  PLT 149* 180 162   Cardiac Enzymes: No results found for this basename: CKTOTAL, CKMB, CKMBINDEX, TROPONINI,  in the last 168  hours BNP: BNP (last 3 results) No results found for this basename: PROBNP,  in the last 8760 hours CBG:  Recent Labs Lab 05/24/13 1712  GLUCAP 94       Signed:  Albert Hersch M  [facility] 05/30/2013, 10:20 AM

## 2013-05-30 NOTE — Progress Notes (Signed)
UR chart review completed.  

## 2013-05-30 NOTE — Progress Notes (Signed)
Patient ID: Aaron Gordon, male   DOB: February 21, 1946, 68 y.o.   MRN: [id number] Output 2200. Urine is clear. Weight 191.3 from 193.  Scrotal edema decreased significantly. 3+ edema to lower extremities. Assessment/Plan: Anasacara related to his alcoholic liver disease/Hepatitis C.  Continue Lasix BID, Spironolactone and Lactulose.  Fluid restriction. Low NA diet. Needs OV in 3-4 weeks. Will plan Hep C in near future.

## 2013-05-30 NOTE — Discharge Summary (Signed)
The patient was seen and examined. He was discussed with nurse practitioner, Ms. Vedia CofferBlack. The patient is asymptomatic. He feels better. He is ambulating around in his room. His vital signs and laboratory studies are stable. Overall, his scrotal edema and lower extremity edema has decreased, but still present. He will be discharged on 60 mg of Lasix daily (decreased from 80 mg daily) and 100 mg of Aldactone twice a day (increased from 50 mg twice a day) per our discussion with Dr. Karilyn Cotaehman. Zaroxolyn which was given during the hospitalization was discontinued upon discharge. The patient diuresed 7.2 L after receiving IV diuretics and albumin infusions x5 days. He will followup with Dr. Karilyn Cotaehman as recommended.

## 2013-06-03 ENCOUNTER — Ambulatory Visit: Payer: Medicare Other | Admitting: Family Medicine

## 2013-06-04 ENCOUNTER — Encounter: Payer: Self-pay | Admitting: Family Medicine

## 2013-06-04 ENCOUNTER — Ambulatory Visit (INDEPENDENT_AMBULATORY_CARE_PROVIDER_SITE_OTHER): Payer: Medicare Other | Admitting: Family Medicine

## 2013-06-04 ENCOUNTER — Ambulatory Visit: Payer: Medicare Other | Attending: Internal Medicine | Admitting: Physical Therapy

## 2013-06-04 VITALS — BP 116/71 | HR 90 | Temp 97.8°F | Ht 68.0 in | Wt 186.2 lb

## 2013-06-04 DIAGNOSIS — K7460 Unspecified cirrhosis of liver: Secondary | ICD-10-CM | POA: Insufficient documentation

## 2013-06-04 DIAGNOSIS — R609 Edema, unspecified: Secondary | ICD-10-CM

## 2013-06-04 DIAGNOSIS — R222 Localized swelling, mass and lump, trunk: Secondary | ICD-10-CM

## 2013-06-04 DIAGNOSIS — M6281 Muscle weakness (generalized): Secondary | ICD-10-CM | POA: Insufficient documentation

## 2013-06-04 DIAGNOSIS — IMO0001 Reserved for inherently not codable concepts without codable children: Secondary | ICD-10-CM | POA: Insufficient documentation

## 2013-06-04 DIAGNOSIS — E878 Other disorders of electrolyte and fluid balance, not elsewhere classified: Secondary | ICD-10-CM

## 2013-06-04 DIAGNOSIS — R5381 Other malaise: Secondary | ICD-10-CM | POA: Insufficient documentation

## 2013-06-04 LAB — POCT CBC
Granulocyte percent: 56 %G (ref 37–80)
HCT, POC: 33.3 % — AB (ref 43.5–53.7)
Hemoglobin: 10.6 g/dL — AB (ref 14.1–18.1)
Lymph, poc: 2.5 (ref 0.6–3.4)
MCH, POC: 32.5 pg — AB (ref 27–31.2)
MCHC: 32 g/dL (ref 31.8–35.4)
MCV: 101.7 fL — AB (ref 80–97)
MPV: 9.2 fL (ref 0–99.8)
POC Granulocyte: 4.6 (ref 2–6.9)
POC LYMPH PERCENT: 30.5 %L (ref 10–50)
Platelet Count, POC: 186 10*3/uL (ref 142–424)
RBC: 3.3 M/uL — AB (ref 4.69–6.13)
RDW, POC: 16.7 %
WBC: 8.2 10*3/uL (ref 4.6–10.2)

## 2013-06-04 MED ORDER — AMOXICILLIN 875 MG PO TABS: 875.0000 mg | ORAL_TABLET | Freq: Two times a day (BID) | ORAL | Status: DC

## 2013-06-04 NOTE — Progress Notes (Signed)
   Subjective:    Patient ID: Aaron Gordon, male    DOB: 20-Oct-1945, 68 y.o.   MRN: [id number]  HPI  This 68 y.o. male presents for evaluation of hospital follow up.  He was hospitalized for Fluid overload due to cirrhosis of the liver.  He was treated with albumin and diuretics and his Edema has gone down in his legs.  He feels better.  He is c/o left chest wall mass.  Review of Systems C/o edema No chest pain, SOB, HA, dizziness, vision change, N/V, diarrhea, constipation, dysuria, urinary urgency or frequency, myalgias, arthralgias or rash.     Objective:   Physical Exam  Vital signs noted  Chronically ill appearing male  HEENT - Head atraumatic Normocephalic                Eyes - PERRLA, Conjuctiva - clear Sclera- Clear EOMI                Ears - EAC's Wnl TM's Wnl Gross Hearing WNL                Throat - oropharanx wnl Respiratory - Lungs CTA bilateral Cardiac - RRR S1 and S2 without murmur GI - Abdomen soft Nontender and bowel sounds active x 4 Extremities - Legs 2 plus edema Skin - Mass in left chest wall which is warm and tender.     Assessment & Plan:  Electrolyte imbalance - Plan: CMP14+EGFR, amoxicillin (AMOXIL) 875 MG tablet, POCT CBC  Swelling, mass, or lump in chest - Plan: amoxicillin (AMOXIL) 875 MG tablet, [place] Misc Soft Tissue  Edema - Plan: POCT CBC  Cirrhosis of liver without mention of alcohol - Plan: POCT CBC  Lysbeth Penner FNP

## 2013-06-04 NOTE — Patient Instructions (Signed)
Edema Edema is an abnormal build-up of fluids in tissues. Because this is partly dependent on gravity (water flows to the lowest place), it is more common in the legs and thighs (lower extremities). It is also common in the looser tissues, like around the eyes. Painless swelling of the feet and ankles is common and increases as a person ages. It may affect both legs and may include the calves or even thighs. When squeezed, the fluid may move out of the affected area and may leave a dent for a few moments. CAUSES   Prolonged standing or sitting in one place for extended periods of time. Movement helps pump tissue fluid into the veins, and absence of movement prevents this, resulting in edema.  Varicose veins. The valves in the veins do not work as well as they should. This causes fluid to leak into the tissues.  Fluid and salt overload.  Injury, burn, or surgery to the leg, ankle, or foot, may damage veins and allow fluid to leak out.  Sunburn damages vessels. Leaky vessels allow fluid to go out into the sunburned tissues.  Allergies (from insect bites or stings, medications or chemicals) cause swelling by allowing vessels to become leaky.  Protein in the blood helps keep fluid in your vessels. Low protein, as in malnutrition, allows fluid to leak out.  Hormonal changes, including pregnancy and menstruation, cause fluid retention. This fluid may leak out of vessels and cause edema.  Medications that cause fluid retention. Examples are sex hormones, blood pressure medications, steroid treatment, or anti-depressants.  Some illnesses cause edema, especially heart failure, kidney disease, or liver disease.  Surgery that cuts veins or lymph nodes, such as surgery done for the heart or for breast cancer, may result in edema. DIAGNOSIS  Your caregiver is usually easily able to determine what is causing your swelling (edema) by simply asking what is wrong (getting a history) and examining you (doing  a physical). Sometimes x-rays, EKG (electrocardiogram or heart tracing), and blood work may be done to evaluate for underlying medical illness. TREATMENT  General treatment includes:  Leg elevation (or elevation of the affected body part).  Restriction of fluid intake.  Prevention of fluid overload.  Compression of the affected body part. Compression with elastic bandages or support stockings squeezes the tissues, preventing fluid from entering and forcing it back into the blood vessels.  Diuretics (also called water pills or fluid pills) pull fluid out of your body in the form of increased urination. These are effective in reducing the swelling, but can have side effects and must be used only under your caregiver's supervision. Diuretics are appropriate only for some types of edema. The specific treatment can be directed at any underlying causes discovered. Heart, liver, or kidney disease should be treated appropriately. HOME CARE INSTRUCTIONS   Elevate the legs (or affected body part) above the level of the heart, while lying down.  Avoid sitting or standing still for prolonged periods of time.  Avoid putting anything directly under the knees when lying down, and do not wear constricting clothing or garters on the upper legs.  Exercising the legs causes the fluid to work back into the veins and lymphatic channels. This may help the swelling go down.  The pressure applied by elastic bandages or support stockings can help reduce ankle swelling.  A low-salt diet may help reduce fluid retention and decrease the ankle swelling.  Take any medications exactly as prescribed. SEEK MEDICAL CARE IF:  Your edema is   not responding to recommended treatments. SEEK IMMEDIATE MEDICAL CARE IF:   You develop shortness of breath or chest pain.  You cannot breathe when you lay down; or if, while lying down, you have to get up and go to the window to get your breath.  You are having increasing  swelling without relief from treatment.  You develop a fever over 102 F (38.9 C).  You develop pain or redness in the areas that are swollen.  Tell your caregiver right away if you have gained 03 lb/1.4 kg in 1 day or 05 lb/2.3 kg in a week. MAKE SURE YOU:   Understand these instructions.  Will watch your condition.  Will get help right away if you are not doing well or get worse. Document Released: 04/18/2005 Document Revised: 10/18/2011 Document Reviewed: 12/05/2007 [employer] Patient Information 2014 [employer].  

## 2013-06-05 ENCOUNTER — Telehealth: Payer: Self-pay | Admitting: Family Medicine

## 2013-06-05 LAB — CMP14+EGFR
ALT: 24 IU/L (ref 0–44)
AST: 64 IU/L — ABNORMAL HIGH (ref 0–40)
Albumin/Globulin Ratio: 0.6 — ABNORMAL LOW (ref 1.1–2.5)
Albumin: 2.5 g/dL — ABNORMAL LOW (ref 3.6–4.8)
Alkaline Phosphatase: 133 IU/L — ABNORMAL HIGH (ref 39–117)
BUN/Creatinine Ratio: 19 (ref 10–22)
BUN: 16 mg/dL (ref 8–27)
CO2: 22 mmol/L (ref 18–29)
Calcium: 8.3 mg/dL — ABNORMAL LOW (ref 8.6–10.2)
Chloride: 99 mmol/L (ref 97–108)
Creatinine, Ser: 0.84 mg/dL (ref 0.76–1.27)
GFR calc Af Amer: 105 mL/min/{1.73_m2} (ref >59)
GFR calc non Af Amer: 91 mL/min/{1.73_m2} (ref >59)
Globulin, Total: 4.1 g/dL (ref 1.5–4.5)
Glucose: 89 mg/dL (ref 65–99)
Potassium: 4.6 mmol/L (ref 3.5–5.2)
Sodium: 135 mmol/L (ref 134–144)
Total Bilirubin: 1 mg/dL (ref 0.0–1.2)
Total Protein: 6.6 g/dL (ref 6.0–8.5)

## 2013-06-06 ENCOUNTER — Ambulatory Visit: Payer: Medicare Other | Admitting: Physical Therapy

## 2013-06-10 ENCOUNTER — Ambulatory Visit (HOSPITAL_COMMUNITY)
Admission: RE | Admit: 2013-06-10 | Discharge: 2013-06-10 | Disposition: A | Discharge status: Home / Self Care | Payer: Medicare Other | Source: Ambulatory Visit | Attending: Family Medicine | Admitting: Family Medicine

## 2013-06-10 ENCOUNTER — Encounter: Payer: Medicare Other | Admitting: Physical Therapy

## 2013-06-10 DIAGNOSIS — R222 Localized swelling, mass and lump, trunk: Secondary | ICD-10-CM | POA: Insufficient documentation

## 2013-06-10 NOTE — Telephone Encounter (Signed)
He has an US which has been ordered

## 2013-06-11 ENCOUNTER — Ambulatory Visit (INDEPENDENT_AMBULATORY_CARE_PROVIDER_SITE_OTHER): Payer: Medicare Other | Admitting: Internal Medicine

## 2013-06-11 ENCOUNTER — Other Ambulatory Visit: Payer: Self-pay | Admitting: Family Medicine

## 2013-06-11 ENCOUNTER — Encounter (INDEPENDENT_AMBULATORY_CARE_PROVIDER_SITE_OTHER): Payer: Self-pay | Admitting: Internal Medicine

## 2013-06-11 VITALS — BP 110/70 | HR 78 | Temp 97.5°F | Resp 18 | Ht 68.0 in | Wt 181.8 lb

## 2013-06-11 DIAGNOSIS — R222 Localized swelling, mass and lump, trunk: Secondary | ICD-10-CM

## 2013-06-11 DIAGNOSIS — K5900 Constipation, unspecified: Secondary | ICD-10-CM

## 2013-06-11 DIAGNOSIS — K219 Gastro-esophageal reflux disease without esophagitis: Secondary | ICD-10-CM

## 2013-06-11 DIAGNOSIS — R188 Other ascites: Secondary | ICD-10-CM

## 2013-06-11 DIAGNOSIS — K7460 Unspecified cirrhosis of liver: Secondary | ICD-10-CM

## 2013-06-11 DIAGNOSIS — B182 Chronic viral hepatitis C: Secondary | ICD-10-CM

## 2013-06-11 MED ORDER — LACTULOSE 10 GM/15ML PO SOLN: 20.0000 g | Freq: Every day | ORAL | Status: DC | PRN

## 2013-06-11 MED ORDER — OMEPRAZOLE 20 MG PO CPDR: 20.0000 mg | DELAYED_RELEASE_CAPSULE | Freq: Every day | ORAL | Status: AC

## 2013-06-11 MED ORDER — LEDIPASVIR-SOFOSBUVIR 90-400 MG PO TABS: 1.0000 | ORAL_TABLET | Freq: Every day | ORAL | Status: DC

## 2013-06-11 MED ORDER — FUROSEMIDE 20 MG PO TABS: 40.0000 mg | ORAL_TABLET | Freq: Every day | ORAL | Status: DC

## 2013-06-11 NOTE — Progress Notes (Signed)
Presenting complaint;  Followup for decompensated liver disease. Patient has chronic hepatitis C.  Subjective:  Patient is 68 year old Hispanic male who has decompensated liver disease with cirrhosis associated with ascites and fluid overload. He has genotype 1B disease. He was hospitalized about 2 weeks ago for anasarca and responded to IV albumin and diuretic therapy. Since discharge on 05/30/2013 he has lost another 10 pounds. He has good appetite. He is watching his salt intake. He denies shortness of breath abdominal pain or dysuria. He has noted decrease in LE edema. 2 or 3 days after discharge from the hospital he noted painful bump at left chest. He was seen at [facility] by Mr. Nils Pyle FNP and begun on amoxicillin. He had ultrasound yesterday which revealed nonspecific lesion. Patient states he is having less pain. He also brings along a letter from insurance carrier indicating that Harvoni has been approved for treatment. He needs new prescription for lactulose. He denies confusion and he is still having difficulty sleeping. He is getting some sleep and lorazepam. Patient has not had any alcohol in him close to 3 months.   Current Medications: Current Outpatient Prescriptions  Medication Sig Dispense Refill  . albuterol (PROVENTIL HFA;VENTOLIN HFA) 108 (90 BASE) MCG/ACT inhaler Inhale 2 puffs into the lungs every 6 (six) hours as needed for wheezing or shortness of breath.  1 Inhaler  2  . amoxicillin (AMOXIL) 875 MG tablet Take 1 tablet (875 mg total) by mouth 2 (two) times daily.  20 tablet  0  . furosemide (LASIX) 20 MG tablet Take 3 tablets (60 mg total) by mouth daily.  90 tablet  0  . HYDROcodone-acetaminophen (NORCO/VICODIN) 5-325 MG per tablet Take 1-2 tablets by mouth every 4 (four) hours as needed for moderate pain.  30 tablet  0  . LORazepam (ATIVAN) 1 MG tablet Take 1 tablet (1 mg total) by mouth at bedtime.  30 tablet  0  . Multiple  Vitamins-Minerals (CENTRUM SILVER ADULT 50+ PO) Take 1 tablet by mouth daily.      Marland Kitchen omeprazole (PRILOSEC) 40 MG capsule Take 1 capsule (40 mg total) by mouth daily.  60 capsule  3  . spironolactone (ALDACTONE) 100 MG tablet Take 1 tablet (100 mg total) by mouth 2 (two) times daily.  60 tablet  0  . tiotropium (SPIRIVA) 18 MCG inhalation capsule Place 1 capsule (18 mcg total) into inhaler and inhale daily.  30 capsule  12  . lactulose (CHRONULAC) 10 GM/15ML solution Take 30 mLs (20 g total) by mouth daily at 12 noon.  240 mL  0   No current facility-administered medications for this visit.     Objective: Blood pressure 110/70, pulse 78, temperature 97.5 F (36.4 C), temperature source Oral, resp. rate 18, height 5\' 8"  (1.727 m), weight 181 lb 12.8 oz (82.464 kg). Patient is alert and does not have asterixis. Conjunctiva is pink. Sclera is nonicteric Oropharyngeal mucosa is normal. No neck masses or thyromegaly noted. He has a 3 x 6 subcutaneous mass in the left chest along anterior axillary line. It is mildly tender and somewhat mobile. Cardiac exam with regular rhythm normal S1 and S2. No murmur or gallop noted. Lungs are clear to auscultation. Abdomen is symmetrical soft and nontender. Spleen tip is nonpalpable. Liver edge is firm. He has 2+ pitting edema involving two thirds of both legs..  Labs/studies Results: Abdominal from 06/04/2013. WBC 8.2, H&H 10.6 and 33.3 and platelet count 186K Serum sodium 135, potassium 4.6,  chloride 99, CO2 22, BUN 16, creatinine 0.84. Bilirubin 1.0, AP 133, AST 64, ALT 24, total protein 6.6 and albumin 2.5. Ultrasound from 06/10/2013 reviewed. 2 hypoechoic lesions identified in left lateral chest wall. Larger lesion measured 2.6 x 3.3 x 0.8 shows internal blood flow and the other one does not. These lesions are nonspecific could be inflammatory infectious posttraumatic but tumor not be excluded.     Assessment:  #1. Chronic hepatitis C with  cirrhosis. He has genotype 1b disease. He has decompensated disease as evidenced by ascites and fluid overload and mild coagulopathy. He also has anemia felt to be anemia of chronic disease. He has history of gastric ulcer with bleed in November 2014 and has been documented to have healed. He also has been treated for H. pylori infection. He definitely is a candidate for antiviral therapy with Harvoni. #2. Subcutaneous lesion at left lateral chest wall possibly infected sebaceous cyst. He appears to be getting better with amoxicillin. I talked with Mr. Nils PyleWilliam Oxford FNP he is planning for patient to see a surgeon.   Plan: New prescription given for lactulose 20 g by mouth each bedtime. Decrease furosemide to 40 mg by mouth every morning. Decrease omeprazole to 20 mg by mouth every morning. Notify if has lost another 5 pounds. Begin Harvoni(Ledipasvir-Sofosbuvir 90/400 mg) 1 tablet every morning to be taken along with omeprazole. Office visit in 2 weeks.

## 2013-06-11 NOTE — Patient Instructions (Signed)
Take Harvoni by mouth every morning along with omeprazole. All if you lost more than 5 pounds

## 2013-06-13 ENCOUNTER — Ambulatory Visit: Payer: Medicare Other | Admitting: *Deleted

## 2013-06-25 ENCOUNTER — Telehealth (INDEPENDENT_AMBULATORY_CARE_PROVIDER_SITE_OTHER): Payer: Self-pay | Admitting: *Deleted

## 2013-06-25 ENCOUNTER — Ambulatory Visit (INDEPENDENT_AMBULATORY_CARE_PROVIDER_SITE_OTHER): Payer: Medicare Other | Admitting: Internal Medicine

## 2013-06-25 ENCOUNTER — Encounter (INDEPENDENT_AMBULATORY_CARE_PROVIDER_SITE_OTHER): Payer: Self-pay | Admitting: *Deleted

## 2013-06-25 DIAGNOSIS — B1920 Unspecified viral hepatitis C without hepatic coma: Secondary | ICD-10-CM

## 2013-06-25 NOTE — Telephone Encounter (Signed)
The patient started his Hep C medication on 06/17/13. This lab request is 2 weeks post starting the treatment. Patient will be sent a letter as a reminder.

## 2013-06-27 ENCOUNTER — Encounter: Payer: Medicare Other | Admitting: *Deleted

## 2013-06-30 DIAGNOSIS — L02213 Cutaneous abscess of chest wall: Secondary | ICD-10-CM

## 2013-06-30 HISTORY — DX: Cutaneous abscess of chest wall: L02.213

## 2013-07-02 LAB — CBC
HCT: 32.1 % — ABNORMAL LOW (ref 39.0–52.0)
Hemoglobin: 11.2 g/dL — ABNORMAL LOW (ref 13.0–17.0)
MCH: 33.5 pg (ref 26.0–34.0)
MCHC: 34.9 g/dL (ref 30.0–36.0)
MCV: 96.1 fL (ref 78.0–100.0)
Platelets: 187 10*3/uL (ref 150–400)
RBC: 3.34 MIL/uL — ABNORMAL LOW (ref 4.22–5.81)
RDW: 17.2 % — ABNORMAL HIGH (ref 11.5–15.5)
WBC: 6.9 10*3/uL (ref 4.0–10.5)

## 2013-07-02 LAB — BASIC METABOLIC PANEL
BUN: 19 mg/dL (ref 6–23)
CALCIUM: 8.1 mg/dL — AB (ref 8.4–10.5)
CO2: 24 mEq/L (ref 19–32)
Chloride: 102 mEq/L (ref 96–112)
Creat: 0.95 mg/dL (ref 0.50–1.35)
Glucose, Bld: 123 mg/dL — ABNORMAL HIGH (ref 70–99)
Potassium: 4.1 mEq/L (ref 3.5–5.3)
SODIUM: 135 meq/L (ref 135–145)

## 2013-07-03 ENCOUNTER — Ambulatory Visit (INDEPENDENT_AMBULATORY_CARE_PROVIDER_SITE_OTHER): Payer: Medicare Other | Admitting: Internal Medicine

## 2013-07-03 ENCOUNTER — Encounter (INDEPENDENT_AMBULATORY_CARE_PROVIDER_SITE_OTHER): Payer: Self-pay | Admitting: Internal Medicine

## 2013-07-03 VITALS — BP 122/78 | HR 88 | Temp 97.7°F | Resp 18 | Ht 68.0 in | Wt 190.3 lb

## 2013-07-03 DIAGNOSIS — K7460 Unspecified cirrhosis of liver: Secondary | ICD-10-CM

## 2013-07-03 DIAGNOSIS — R21 Rash and other nonspecific skin eruption: Secondary | ICD-10-CM

## 2013-07-03 DIAGNOSIS — G4700 Insomnia, unspecified: Secondary | ICD-10-CM

## 2013-07-03 DIAGNOSIS — E8779 Other fluid overload: Secondary | ICD-10-CM

## 2013-07-03 DIAGNOSIS — B182 Chronic viral hepatitis C: Secondary | ICD-10-CM

## 2013-07-03 DIAGNOSIS — R188 Other ascites: Secondary | ICD-10-CM

## 2013-07-03 DIAGNOSIS — E8770 Fluid overload, unspecified: Secondary | ICD-10-CM

## 2013-07-03 DIAGNOSIS — L299 Pruritus, unspecified: Secondary | ICD-10-CM

## 2013-07-03 MED ORDER — URSODIOL 250 MG PO TABS: 250.0000 mg | ORAL_TABLET | Freq: Two times a day (BID) | ORAL | Status: AC

## 2013-07-03 MED ORDER — FUROSEMIDE 20 MG PO TABS: 40.0000 mg | ORAL_TABLET | Freq: Every day | ORAL | Status: DC

## 2013-07-03 MED ORDER — TRIAMCINOLONE ACETONIDE 0.025 % EX CREA: 1.0000 "application " | TOPICAL_CREAM | Freq: Two times a day (BID) | CUTANEOUS | Status: AC

## 2013-07-03 MED ORDER — URSODIOL 250 MG PO TABS: 250.0000 mg | ORAL_TABLET | Freq: Three times a day (TID) | ORAL | Status: DC

## 2013-07-03 MED ORDER — METOLAZONE 5 MG PO TABS: 5.0000 mg | ORAL_TABLET | ORAL | Status: DC

## 2013-07-03 MED ORDER — LORAZEPAM 1 MG PO TABS: 1.0000 mg | ORAL_TABLET | Freq: Every day | ORAL | Status: DC

## 2013-07-03 NOTE — Progress Notes (Signed)
Presenting complaint;  Followup for chronic hepatitis C cirrhosis and fluid overload.  Subjective:  Patient is 68 year old male who presents for scheduled visit accompanied by his wife Darl PikesSusan. He was last seen on 06/11/2013. He has gained almost 9 pounds since that visit. He states he is into fourth week on Harvoni. He remains with good appetite. He denies nausea vomiting heartburn abdominal pain melena or rectal bleeding. He is having 3 bowel movements daily. He continues complaining of weakness. His wife states he sleeps during the day, stays up at night. He has not had confusion spells. He complains of itching and skin rash. His wife states itching started long before he went on on pill for hepatitis C. He is not interested in referral for transplant evaluation. He has not had any alcohol in 4 months.   Current Medications: Current Outpatient Prescriptions  Medication Sig Dispense Refill  . albuterol (PROVENTIL HFA;VENTOLIN HFA) 108 (90 BASE) MCG/ACT inhaler Inhale 2 puffs into the lungs every 6 (six) hours as needed for wheezing or shortness of breath.  1 Inhaler  2  . furosemide (LASIX) 20 MG tablet Take 2 tablets (40 mg total) by mouth daily.  60 tablet  2  . lactulose (CHRONULAC) 10 GM/15ML solution Take 30 mLs (20 g total) by mouth daily as needed for mild constipation.  240 mL  0  . Ledipasvir-Sofosbuvir (HARVONI) 90-400 MG TABS Take 1 tablet by mouth daily.  28 tablet  5  . Multiple Vitamins-Minerals (CENTRUM SILVER ADULT 50+ PO) Take 1 tablet by mouth daily.      Marland Kitchen. omeprazole (PRILOSEC) 20 MG capsule Take 1 capsule (20 mg total) by mouth daily.  90 capsule  3  . spironolactone (ALDACTONE) 100 MG tablet Take 1 tablet (100 mg total) by mouth 2 (two) times daily.  60 tablet  0  . tiotropium (SPIRIVA) 18 MCG inhalation capsule Place 1 capsule (18 mcg total) into inhaler and inhale daily.  30 capsule  12  . amoxicillin (AMOXIL) 875 MG tablet Take 1 tablet (875 mg total) by mouth 2 (two)  times daily.  20 tablet  0  . LORazepam (ATIVAN) 1 MG tablet Take 1 tablet (1 mg total) by mouth at bedtime.  30 tablet  0   No current facility-administered medications for this visit.     Objective: Blood pressure 122/78, pulse 88, temperature 97.7 F (36.5 C), temperature source Oral, resp. rate 18, height 5\' 8"  (1.727 m), weight 190 lb 4.8 oz (86.32 kg). Patient is alert and in no acute distress. He does not have asterixis. Conjunctiva is pink. Sclera is nonicteric Oropharyngeal mucosa is normal. No neck masses or thyromegaly noted. Left chest wall mass has resolved. Cardiac exam with regular rhythm normal S1 and S2. No murmur or gallop noted. Lungs are clear to auscultation. Abdomen is full but soft and nontender without organomegaly or masses. He has 2-3+ pitting edema involving both legs and some proximal to the knees. He has scratch marks all over his body with macular rash over a large area and right flank.  Labs/studies Results: Lab data from 07/01/2013. WBC 6.9, H&H 11.2 and 32.1 and platelet count 187K. Sodium 135, serum potassium 4.1, chloride 102, CO2 24, BUN 19, creatinine 0.95 and glucose 123.    Assessment:  #1. Chronic hepatitis C with decompensated cirrhosis. He is tolerating Harvoni. He will be due for blood work after 4 weeks of therapy. Orders are treated for total of 24 weeks if he is able to tolerate this  medication. #2. Fluid overload with minimal ascites. He has gained close to 9 ounces his last visit. Renal function is well-preserved and electrolytes are normal. #3. Skin rash with pruritus. Concerned that this could be related to Lasix or spironolactone but may be due to liver disease. #4. Anemia possibly of chronic disease. H&H's gradually coming up. #5. Insomnia. It appears that his sleep pattern is reversed. He needs to stop taking naps during the daytime sleepiness sleep at night.  Plan:  Patient will have comprehensive chemistry panel and HCVRNA  by PCR when he finishes 4 weeks of antiviral therapy. Triamcinolone cream 0.025% to be applied to rash twice a day. Urso 250 mg by mouth twice a day. New prescription given for rest of them 1 mg by mouth each bedtime. Metolazone 5 mg by mouth every other day x6 doses. He can try Benadryl OTC25 mg by mouth twice a day when necessary for pruritus. Office visit in 4 weeks.

## 2013-07-03 NOTE — Patient Instructions (Signed)
Can titrate lactulose dose to 1-2 tablespoon full daily at bedtime. Benadryl 25 mg by mouth twice daily as needed for pruritis. Call office with progress report next week.

## 2013-07-04 ENCOUNTER — Telehealth (INDEPENDENT_AMBULATORY_CARE_PROVIDER_SITE_OTHER): Payer: Self-pay | Admitting: *Deleted

## 2013-07-04 DIAGNOSIS — B1920 Unspecified viral hepatitis C without hepatic coma: Secondary | ICD-10-CM

## 2013-07-04 NOTE — Telephone Encounter (Signed)
Vonn said he was told by Dr. Karilyn Cotaehman to call and let us know how many Hep C pills he had left. He has 8 and wanted to know if we would be calling that it for him. [facility] he needed to call the number on the bottle and have it refilled. Gave Dr. Karilyn Cotaehman the message and was told he would let Tammy know. Dr. Karilyn Cotaehman said "he will need to have labs next week".

## 2013-07-09 ENCOUNTER — Ambulatory Visit: Payer: Medicare Other | Attending: Internal Medicine | Admitting: *Deleted

## 2013-07-09 ENCOUNTER — Other Ambulatory Visit (INDEPENDENT_AMBULATORY_CARE_PROVIDER_SITE_OTHER): Payer: Self-pay | Admitting: *Deleted

## 2013-07-09 DIAGNOSIS — B1920 Unspecified viral hepatitis C without hepatic coma: Secondary | ICD-10-CM

## 2013-07-09 DIAGNOSIS — M6281 Muscle weakness (generalized): Secondary | ICD-10-CM | POA: Insufficient documentation

## 2013-07-09 DIAGNOSIS — R5381 Other malaise: Secondary | ICD-10-CM | POA: Insufficient documentation

## 2013-07-09 DIAGNOSIS — K7460 Unspecified cirrhosis of liver: Secondary | ICD-10-CM

## 2013-07-09 MED ORDER — SPIRONOLACTONE 100 MG PO TABS: 100.0000 mg | ORAL_TABLET | Freq: Two times a day (BID) | ORAL | Status: DC

## 2013-07-09 NOTE — Telephone Encounter (Signed)
Rx sent to his pharmacy

## 2013-07-09 NOTE — Telephone Encounter (Signed)
Per Dr.Rehman the patient will need to have his lab work drawn next week ( Week of 07-08-13.) Patient was made aware. [facility]

## 2013-07-09 NOTE — Telephone Encounter (Signed)
Patient has called office twice asking about this refill. His Pharmacy has not sent this request to us. Refer to Office notes on 07/03/13.

## 2013-07-11 ENCOUNTER — Ambulatory Visit (INDEPENDENT_AMBULATORY_CARE_PROVIDER_SITE_OTHER): Payer: Medicare Other | Admitting: Surgery

## 2013-07-11 ENCOUNTER — Telehealth (INDEPENDENT_AMBULATORY_CARE_PROVIDER_SITE_OTHER): Payer: Self-pay | Admitting: *Deleted

## 2013-07-11 NOTE — Telephone Encounter (Signed)
Dr.Rehman had written a prescription for Ursodiol 250 mg take twice a day. This medication was on back order. Per Dr.Rehman it is okay to change to ursodiol 300 mg take 1 by mouth twice a day. #60 1 [facility] [place] Katie.

## 2013-07-16 ENCOUNTER — Telehealth (INDEPENDENT_AMBULATORY_CARE_PROVIDER_SITE_OTHER): Payer: Self-pay | Admitting: *Deleted

## 2013-07-16 NOTE — Telephone Encounter (Signed)
Patient's wife called and said Aaron Gordon was running a fever. He is sleeping and staying covered with a blanket. Her thermometer is broken and is not sure of the degrees. [name] to call his PCP and see if they could see him. She is to call back if they can not see him today or tomorrow.

## 2013-07-16 NOTE — Telephone Encounter (Signed)
I have called the patient's wife. No answer and was not able to leave a voice message. Dr.Rehman will be made aware.

## 2013-07-16 NOTE — Telephone Encounter (Signed)
Called the patient's wife back. I left the following message per Dr.Rehman instruction: The patient should be taken to the Emergency Room if he is running a temperature.

## 2013-07-17 ENCOUNTER — Encounter: Payer: Self-pay | Admitting: Family Medicine

## 2013-07-17 ENCOUNTER — Telehealth (INDEPENDENT_AMBULATORY_CARE_PROVIDER_SITE_OTHER): Payer: Self-pay | Admitting: *Deleted

## 2013-07-17 ENCOUNTER — Encounter (HOSPITAL_COMMUNITY): Payer: Self-pay | Admitting: Emergency Medicine

## 2013-07-17 ENCOUNTER — Emergency Department (HOSPITAL_COMMUNITY)
Admission: EM | Admit: 2013-07-17 | Discharge: 2013-07-17 | Disposition: A | Discharge status: Home / Self Care | Payer: Medicare Other | Source: Home / Self Care | Attending: Emergency Medicine | Admitting: Emergency Medicine

## 2013-07-17 ENCOUNTER — Ambulatory Visit (INDEPENDENT_AMBULATORY_CARE_PROVIDER_SITE_OTHER): Payer: Medicare Other | Admitting: Family Medicine

## 2013-07-17 VITALS — BP 116/76 | HR 92 | Temp 98.2°F | Ht 68.0 in | Wt 199.2 lb

## 2013-07-17 DIAGNOSIS — K319 Disease of stomach and duodenum, unspecified: Secondary | ICD-10-CM | POA: Insufficient documentation

## 2013-07-17 DIAGNOSIS — IMO0002 Reserved for concepts with insufficient information to code with codable children: Secondary | ICD-10-CM

## 2013-07-17 DIAGNOSIS — M7989 Other specified soft tissue disorders: Secondary | ICD-10-CM

## 2013-07-17 DIAGNOSIS — Z862 Personal history of diseases of the blood and blood-forming organs and certain disorders involving the immune mechanism: Secondary | ICD-10-CM

## 2013-07-17 DIAGNOSIS — Z79899 Other long term (current) drug therapy: Secondary | ICD-10-CM | POA: Insufficient documentation

## 2013-07-17 DIAGNOSIS — R599 Enlarged lymph nodes, unspecified: Secondary | ICD-10-CM | POA: Insufficient documentation

## 2013-07-17 DIAGNOSIS — Z9089 Acquired absence of other organs: Secondary | ICD-10-CM | POA: Insufficient documentation

## 2013-07-17 DIAGNOSIS — J869 Pyothorax without fistula: Secondary | ICD-10-CM

## 2013-07-17 DIAGNOSIS — F17200 Nicotine dependence, unspecified, uncomplicated: Secondary | ICD-10-CM

## 2013-07-17 DIAGNOSIS — L729 Follicular cyst of the skin and subcutaneous tissue, unspecified: Secondary | ICD-10-CM

## 2013-07-17 DIAGNOSIS — Z8619 Personal history of other infectious and parasitic diseases: Secondary | ICD-10-CM | POA: Insufficient documentation

## 2013-07-17 DIAGNOSIS — Z8679 Personal history of other diseases of the circulatory system: Secondary | ICD-10-CM | POA: Insufficient documentation

## 2013-07-17 DIAGNOSIS — L723 Sebaceous cyst: Secondary | ICD-10-CM

## 2013-07-17 DIAGNOSIS — Z8639 Personal history of other endocrine, nutritional and metabolic disease: Secondary | ICD-10-CM | POA: Insufficient documentation

## 2013-07-17 DIAGNOSIS — R109 Unspecified abdominal pain: Secondary | ICD-10-CM | POA: Insufficient documentation

## 2013-07-17 LAB — CBC WITH DIFFERENTIAL/PLATELET
BASOS ABS: 0 10*3/uL (ref 0.0–0.1)
BASOS PCT: 0 % (ref 0–1)
Eosinophils Absolute: 0.4 10*3/uL (ref 0.0–0.7)
Eosinophils Relative: 4 % (ref 0–5)
HEMATOCRIT: 33.6 % — AB (ref 39.0–52.0)
Hemoglobin: 11.9 g/dL — ABNORMAL LOW (ref 13.0–17.0)
LYMPHS PCT: 14 % (ref 12–46)
Lymphs Abs: 1.5 10*3/uL (ref 0.7–4.0)
MCH: 34.7 pg — ABNORMAL HIGH (ref 26.0–34.0)
MCHC: 35.4 g/dL (ref 30.0–36.0)
MCV: 98 fL (ref 78.0–100.0)
MONO ABS: 1.4 10*3/uL — AB (ref 0.1–1.0)
Monocytes Relative: 13 % — ABNORMAL HIGH (ref 3–12)
NEUTROS ABS: 6.9 10*3/uL (ref 1.7–7.7)
Neutrophils Relative %: 68 % (ref 43–77)
PLATELETS: 249 10*3/uL (ref 150–400)
RBC: 3.43 MIL/uL — ABNORMAL LOW (ref 4.22–5.81)
RDW: 15.7 % — ABNORMAL HIGH (ref 11.5–15.5)
WBC: 10.1 10*3/uL (ref 4.0–10.5)

## 2013-07-17 LAB — COMPREHENSIVE METABOLIC PANEL
ALBUMIN: 1.4 g/dL — AB (ref 3.5–5.2)
ALT: 18 U/L (ref 0–53)
AST: 49 U/L — AB (ref 0–37)
Alkaline Phosphatase: 148 U/L — ABNORMAL HIGH (ref 39–117)
BUN: 23 mg/dL (ref 6–23)
CO2: 26 meq/L (ref 19–32)
CREATININE: 0.89 mg/dL (ref 0.50–1.35)
Calcium: 8.1 mg/dL — ABNORMAL LOW (ref 8.4–10.5)
Chloride: 97 mEq/L (ref 96–112)
GFR calc Af Amer: >90 mL/min (ref >90)
GFR calc non Af Amer: 87 mL/min — ABNORMAL LOW (ref >90)
Glucose, Bld: 107 mg/dL — ABNORMAL HIGH (ref 70–99)
POTASSIUM: 3.8 meq/L (ref 3.7–5.3)
SODIUM: 133 meq/L — AB (ref 137–147)
Total Bilirubin: 1.3 mg/dL — ABNORMAL HIGH (ref 0.3–1.2)
Total Protein: 7.4 g/dL (ref 6.0–8.3)

## 2013-07-17 MED ORDER — ALBUTEROL SULFATE HFA 108 (90 BASE) MCG/ACT IN AERS
2.0000 | INHALATION_SPRAY | Freq: Four times a day (QID) | RESPIRATORY_TRACT | Status: DC | PRN
  Administered 2013-07-17: 2 via RESPIRATORY_TRACT
  Filled 2013-07-17: qty 6.7

## 2013-07-17 MED ORDER — DOXYCYCLINE HYCLATE 100 MG PO CAPS: 100.0000 mg | ORAL_CAPSULE | Freq: Two times a day (BID) | ORAL | Status: DC

## 2013-07-17 MED ORDER — SODIUM CHLORIDE 0.9 % IV SOLN
INTRAVENOUS | Status: DC
  Administered 2013-07-17: 15:00:00 via INTRAVENOUS

## 2013-07-17 MED ORDER — IPRATROPIUM-ALBUTEROL 0.5-2.5 (3) MG/3ML IN SOLN
3.0000 mL | Freq: Once | RESPIRATORY_TRACT | Status: AC
  Administered 2013-07-17: 3 mL via RESPIRATORY_TRACT
  Filled 2013-07-17: qty 3

## 2013-07-17 MED ORDER — HYDROCODONE-ACETAMINOPHEN 5-325 MG PO TABS: 1.0000 | ORAL_TABLET | Freq: Four times a day (QID) | ORAL | Status: DC | PRN

## 2013-07-17 MED ORDER — VANCOMYCIN HCL IN DEXTROSE 1-5 GM/200ML-% IV SOLN
1000.0000 mg | Freq: Once | INTRAVENOUS | Status: AC
  Administered 2013-07-17: 1000 mg via INTRAVENOUS
  Filled 2013-07-17: qty 200

## 2013-07-17 MED ORDER — ONDANSETRON HCL 4 MG/2ML IJ SOLN
4.0000 mg | Freq: Once | INTRAMUSCULAR | Status: AC
  Administered 2013-07-17: 4 mg via INTRAVENOUS
  Filled 2013-07-17: qty 2

## 2013-07-17 MED ORDER — LIDOCAINE HCL (PF) 2 % IJ SOLN
INTRAMUSCULAR | Status: AC
  Administered 2013-07-17: 22:00:00
  Filled 2013-07-17: qty 10

## 2013-07-17 MED ORDER — HYDROMORPHONE HCL PF 1 MG/ML IJ SOLN
1.0000 mg | Freq: Once | INTRAMUSCULAR | Status: AC
  Administered 2013-07-17: 1 mg via INTRAVENOUS
  Filled 2013-07-17: qty 1

## 2013-07-17 MED ORDER — LIDOCAINE HCL (PF) 1 % IJ SOLN
INTRAMUSCULAR | Status: AC
  Filled 2013-07-17: qty 5

## 2013-07-17 NOTE — Telephone Encounter (Signed)
Dr.Rehman will be made aware. 

## 2013-07-17 NOTE — ED Notes (Signed)
Pt moved to fast track per EDP for drainage of cyst.

## 2013-07-17 NOTE — Discharge Instructions (Signed)
Take pain medicine as needed. Take antibiotic as directed for the next 7 days. Important to start them by tomorrow. Followup with your doctor in 2 days. As for recheck of the cyst and for the I&D wound sites. Return for any new or worse symptoms.

## 2013-07-17 NOTE — Progress Notes (Signed)
   Subjective:    Patient ID: Aaron Gordon, male    DOB: June 21, 1945, 68 y.o.   MRN: [id number]  HPI This 68 y.o. male presents for evaluation of fever that is on and off for 3 days.  He c/o fatigue Decreased appetite and feeling bad.  He has been weak laying in bed and his wife states he Has not been eating and drinking well.  I ask him where the fever is coming from and he states He has multiple boils that are draining and he has a staph infection.  His wife states she was unaware And did not mention this.  She states he doesn't tell her everything.  He has hx of cirrhosis and hepatitis c.  He has end stage liver disease.     Review of Systems No chest pain, SOB, HA, dizziness, vision change, N/V, diarrhea, constipation, dysuria, urinary urgency or frequency, myalgias, arthralgias or rash.     Objective:   Physical Exam   Vital signs noted  Chronically ill appearing male in NAD  HEENT - Head atraumatic Normocephalic                Eyes - PERRLA, Conjuctiva - clear Sclera- Clear EOMI                Ears - EAC's Wnl TM's Wnl Gross Hearing WNL                Throat - oropharanx wnl Respiratory - Lungs with exp wheezes bilateral Cardiac - RRR S1 and S2 without murmur GI - Abdomen soft Nontender and bowel sounds active x 4 Skin - Right axilla to right pectoral region with erythema, abscess, induration involving the Right pectoral region and axilla with 2 open areas with exudate.  S/p exp lap Scar with induration erythema and discomfort in the epigastric area, right buttocks With erythema and abscess.     Assessment & Plan:  Abscess of chest Recommend he go to ED for workup and discussed [facility]. Because of his co-morbids and the large area of abscess he should be seen in the ED now And recommend he get there now, he leaves with wife and goes to ED.  Deatra CanterWilliam J Jamisyn Langer FNP

## 2013-07-17 NOTE — ED Notes (Signed)
resp paged. Pt resting in bed with nad noted. Pt reports last breathing tx " made me feel better"

## 2013-07-17 NOTE — ED Notes (Signed)
Pt reports two cyst to his rt chest area.  Pt reports the cyst being there for about 4 days.  Pt reports severe pain and drainage to the areas.  Area looks red and swollen.

## 2013-07-17 NOTE — ED Provider Notes (Addendum)
CSN: [id number]     Arrival date & time 07/17/13  1041 History  This chart was scribed for Shelda Jakes, MD by Leone Payor, ED Scribe. This patient was seen in room APA15/APA15 and the patient's care was started 1:37 PM.    Chief Complaint  Patient presents with  . Cyst      The history is provided by the patient. No language interpreter was used.    HPI Comments: Aaron Gordon is a 68 y.o. male who presents to the Emergency Department complaining of 2 weeks of gradual onset, gradually worsening, constant abscess with associated redness and swelling to the right chest. He rates his pain as 8/10 currently. Pt states he has a history of similar symptoms in the past but have resolved without medical attention. Pt was seen at [facility] who advised patient to come to the ED. He denies fever, nausea, vomiting   PCP Alvester Morin at [facility] GI Dr. Karilyn Cota  Past Medical History  Diagnosis Date  . Stomach ulcer 1970's  . Anemia   . Erosive esophagitis 03/15/2013  . Gastric ulcer with hemorrhage 03/15/2013  . Portal hypertensive gastropathy 03/15/2013  . 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 Alcohol abuse   . Hepatitis C antibody test positive 03/15/2013  . Hypoalbuminemia 03/15/2013  . Liver cirrhosis 03/12/2013    From ETOH and Hep C  . Tobacco abuse 03/15/2013  . H. pylori infection 03/15/2013  . Diastolic dysfunction 03/15/2013    Grade 1. Ejection fraction 60%.   Past Surgical History  Procedure Laterality Date  . Splenectomy, total  1971  . Esophagogastroduodenoscopy N/A 03/13/2013    Procedure: ESOPHAGOGASTRODUODENOSCOPY (EGD);  Surgeon: Malissa Hippo, MD;  [facility]: [facility];  Service: Endoscopy;  Laterality: N/A;  . Esophagogastroduodenoscopy N/A 05/09/2013    Procedure: ESOPHAGOGASTRODUODENOSCOPY (EGD);  Surgeon: Malissa Hippo, MD;  Location: [facility];  Service: Endoscopy;  Laterality: N/A;  340   Family History  Problem Relation Age  of Onset  . Cancer Mother     intestinal   History  Substance Use Topics  . Smoking status: Current Some Day Smoker -- 0.25 packs/day for 47 years    Types: Cigarettes  . Smokeless tobacco: Never Used     Comment: Patient statest he smokes 1-2 cigarettes a day  . Alcohol Use: No     Comment: former daily drinker - patient statest that he stopped 2 months ago.    Review of Systems  Constitutional: Negative for fever and chills.  HENT: Negative for rhinorrhea and sore throat.   Eyes: Negative for visual disturbance.  Respiratory: Negative for cough and shortness of breath.   Cardiovascular: Positive for chest pain and leg swelling.  Gastrointestinal: Positive for abdominal pain. Negative for nausea, vomiting and diarrhea.  Genitourinary: Negative for dysuria.  Musculoskeletal: Negative for back pain and neck pain.  Skin: Positive for rash.  Neurological: Negative for headaches.  Hematological: Does not bruise/bleed easily.  Psychiatric/Behavioral: Negative for confusion.      Allergies  Review of patient's allergies indicates no known allergies.  Home Medications   Current Outpatient Rx  Name  Route  Sig  Dispense  Refill  . albuterol (PROVENTIL HFA;VENTOLIN HFA) 108 (90 BASE) MCG/ACT inhaler   Inhalation   Inhale 2 puffs into the lungs every 6 (six) hours as needed for wheezing or shortness of breath.   1 Inhaler   2   . furosemide (LASIX) 20 MG  tablet   Oral   Take 2 tablets (40 mg total) by mouth daily.   60 tablet   2   . lactulose (CHRONULAC) 10 GM/15ML solution   Oral   Take 30 mLs (20 g total) by mouth daily as needed for mild constipation.   240 mL   0   . Ledipasvir-Sofosbuvir (HARVONI) 90-400 MG TABS   Oral   Take 1 tablet by mouth daily.   28 tablet   5   . LORazepam (ATIVAN) 1 MG tablet   Oral   Take 1 tablet (1 mg total) by mouth at bedtime.   30 tablet   0   . Multiple Vitamins-Minerals (CENTRUM SILVER ADULT 50+ PO)   Oral   Take 1  tablet by mouth daily.         Marland Kitchen omeprazole (PRILOSEC) 20 MG capsule   Oral   Take 1 capsule (20 mg total) by mouth daily.   90 capsule   3   . spironolactone (ALDACTONE) 100 MG tablet   Oral   Take 1 tablet (100 mg total) by mouth 2 (two) times daily.   60 tablet   2   . tiotropium (SPIRIVA) 18 MCG inhalation capsule   Inhalation   Place 1 capsule (18 mcg total) into inhaler and inhale daily.   30 capsule   12   . triamcinolone (KENALOG) 0.025 % cream   Topical   Apply 1 application topically 2 (two) times daily. Glide 2 area of his skin rash twice daily as directed   80 g   1   . ursodiol (ACTIGALL) 250 MG tablet   Oral   Take 1 tablet (250 mg total) by mouth 2 (two) times daily.   60 tablet   1   . doxycycline (VIBRAMYCIN) 100 MG capsule   Oral   Take 1 capsule (100 mg total) by mouth 2 (two) times daily.   14 capsule   0   . HYDROcodone-acetaminophen (NORCO/VICODIN) 5-325 MG per tablet   Oral   Take 1-2 tablets by mouth every 6 (six) hours as needed for moderate pain.   20 tablet   0    BP 134/92  Pulse 93  Temp(Src) 97.8 F (36.6 C) (Oral)  Resp 20  SpO2 98% Physical Exam  Nursing note and vitals reviewed. Constitutional: He is oriented to person, place, and time. He appears well-developed and well-nourished.  HENT:  Head: Normocephalic and atraumatic.  Cardiovascular: Normal rate, regular rhythm and normal heart sounds.   Pulmonary/Chest: Effort normal and breath sounds normal. No respiratory distress. He has no wheezes. He has no rales. He exhibits tenderness.  Abdominal: He exhibits no distension.  Healing skin cysts all over the abdomen.   Musculoskeletal: He exhibits edema (bilaterally to BLE).  Lymphadenopathy:  Lymph node swelling in the right axillary measuring 2 x 4 cm   Neurological: He is alert and oriented to person, place, and time.  Skin: Skin is warm and dry.  7 cm of erythema on right chest with area of swelling measuring 2 cm x  4 cm. Another round area of 2 cm with surrounding induration and fluctuance.   Psychiatric: He has a normal mood and affect.    ED Course  Procedures (including critical care time)  DIAGNOSTIC STUDIES: Oxygen Saturation is 100% on RA, normal by my interpretation.    COORDINATION OF CARE: 2:07 PM Discussed treatment plan with pt at bedside and pt agreed to plan.   Labs Review  Labs Reviewed  CBC WITH DIFFERENTIAL - Abnormal; Notable for the following:    RBC 3.43 (*)    Hemoglobin 11.9 (*)    HCT 33.6 (*)    MCH 34.7 (*)    RDW 15.7 (*)    Monocytes Relative 13 (*)    Monocytes Absolute 1.4 (*)    All other components within normal limits  COMPREHENSIVE METABOLIC PANEL - Abnormal; Notable for the following:    Sodium 133 (*)    Glucose, Bld 107 (*)    Calcium 8.1 (*)    Albumin 1.4 (*)    AST 49 (*)    Alkaline Phosphatase 148 (*)    Total Bilirubin 1.3 (*)    GFR calc non Af Amer 87 (*)    All other components within normal limits   Results for orders placed during the hospital encounter of 07/17/13  CBC WITH DIFFERENTIAL      Result Value Ref Range   WBC 10.1  4.0 - 10.5 K/uL   RBC 3.43 (*) 4.22 - 5.81 MIL/uL   Hemoglobin 11.9 (*) 13.0 - 17.0 g/dL   HCT 95.233.6 (*) 84.139.0 - 32.452.0 %   MCV 98.0  78.0 - 100.0 fL   MCH 34.7 (*) 26.0 - 34.0 pg   MCHC 35.4  30.0 - 36.0 g/dL   RDW 40.115.7 (*) 02.711.5 - 25.315.5 %   Platelets 249  150 - 400 K/uL   Neutrophils Relative % 68  43 - 77 %   Neutro Abs 6.9  1.7 - 7.7 K/uL   Lymphocytes Relative 14  12 - 46 %   Lymphs Abs 1.5  0.7 - 4.0 K/uL   Monocytes Relative 13 (*) 3 - 12 %   Monocytes Absolute 1.4 (*) 0.1 - 1.0 K/uL   Eosinophils Relative 4  0 - 5 %   Eosinophils Absolute 0.4  0.0 - 0.7 K/uL   Basophils Relative 0  0 - 1 %   Basophils Absolute 0.0  0.0 - 0.1 K/uL  COMPREHENSIVE METABOLIC PANEL      Result Value Ref Range   Sodium 133 (*) 137 - 147 mEq/L   Potassium 3.8  3.7 - 5.3 mEq/L   Chloride 97  96 - 112 mEq/L   CO2 26  19 -  32 mEq/L   Glucose, Bld 107 (*) 70 - 99 mg/dL   BUN 23  6 - 23 mg/dL   Creatinine, Ser 6.640.89  0.50 - 1.35 mg/dL   Calcium 8.1 (*) 8.4 - 10.5 mg/dL   Total Protein 7.4  6.0 - 8.3 g/dL   Albumin 1.4 (*) 3.5 - 5.2 g/dL   AST 49 (*) 0 - 37 U/L   ALT 18  0 - 53 U/L   Alkaline Phosphatase 148 (*) 39 - 117 U/L   Total Bilirubin 1.3 (*) 0.3 - 1.2 mg/dL   GFR calc non Af Amer 87 (*) >90 mL/min   GFR calc Af Amer >90  >90 mL/min    Imaging Review No results found.   EKG Interpretation None      MDM   Final diagnoses:  Generalized skin cysts    Patient with multiple skin cysts and some scabbed over some healed some acutely infected to 2 on his chest there is a fair amount of fluctuance that probably will benefit from incision and drainage. We'll move the fast track to have that done. No significant leukocytosis. Patient nontoxic no acute distress. Patient given IV dose of vancomycin here  in the emergency department. Also has a history of COPD. Patient is is history of liver cirrhosis. Has some ascites. Wheezing resolved with nebulizer treatments here patient feels better. Patient will be continued on doxycycline and pain medicine as needed. Followup with primary care doctor or back here for wound care.       I personally performed the services described in this documentation, which was scribed in my presence. The recorded information has been reviewed and is accurate.    Shelda Jakes, MD 07/17/13 1850  I&D done by PA. Patient with a feeling of shortness of breath following the procedure no wheezing. Treated without the overall inhaler improved. We'll discharge home. Patient treated with doxycycline pain medication. Patient has followup with primary care Dr. for wound care or to return here in 2 days. Patient given a dose of vancomycin here will start doxycycline tomorrow. Some pus did come out of both lesions. Wound care followup in 2 days.  Shelda Jakes, MD 07/17/13  [phone]

## 2013-07-17 NOTE — ED Notes (Signed)
Pt resting in bed. Audible wheezes. No distress noted. Dr. Deretha EmoryZackowski aware and breathing tx order.

## 2013-07-17 NOTE — ED Notes (Signed)
EDP in with pt 

## 2013-07-17 NOTE — Telephone Encounter (Signed)
Aaron Gordon called and said Aaron Gordon has been put in the hospital at [facility]. He has a staff infection and wanted Dr. Karilyn Gordon to know just incase he needs to see him.

## 2013-07-17 NOTE — ED Provider Notes (Signed)
  I was asked by the EDP, Dr. Deretha EmoryZackowski , to I&D two abscesses to patient's right upper chest wall.  This was my only involvement in this patient's care.      INCISION AND DRAINAGE #1 Performed by: Maxwell CaulRIPLETT,Yisel Megill L. Consent: Verbal consent obtained. Risks and benefits: risks, benefits and alternatives were discussed Type: abscess  Body area: right upper chest Anesthesia: local infiltration  Incision was made with a #11 scalpel.  Local anesthetic: lidocaine 2 % w/o epinephrine  Anesthetic total: 3 ml  Complexity: complex Blunt dissection to break up loculations  Drainage: purulent  Drainage amount: moderate Packing material: 1/4 in iodoform gauze  Patient tolerance: Patient tolerated the procedure well with no immediate complications.      INCISION AND DRAINAGE  #2 Performed by: Maxwell CaulRIPLETT,Durga Saldarriaga L. Consent: Verbal consent obtained. Risks and benefits: risks, benefits and alternatives were discussed Type: abscess  Body area: right upper chest Anesthesia: local infiltration  Incision was made with a #11 scalpel.  Local anesthetic: lidocaine 2 % w/o epinephrine  Anesthetic total: 3 ml  Complexity: complex Blunt dissection to break up loculations  Drainage: purulent  Drainage amount: large  Packing material: 1/4 in iodoform gauze  Patient tolerance: Patient tolerated the procedure well with no immediate complications.     abscesses were bandaged.  Bleeding was minimal.    Victorious Cosio L. Theia Dezeeuw, PA-C 07/17/13 2200

## 2013-07-17 NOTE — ED Provider Notes (Addendum)
Medical screening examination/treatment/procedure(s) were conducted as a shared visit with non-physician practitioner(s) and myself.  I personally evaluated the patient during the encounter.   EKG Interpretation None      Patient seen by me. I&D done by PA.  See my note for details.  Shelda JakesScott W. Cadey Bazile, MD 07/17/13 2147    Shelda JakesScott W. Corinthia Helmers, MD 07/17/13 [phone]

## 2013-07-18 NOTE — Telephone Encounter (Signed)
I checked yesterday and he was never hospitalized.

## 2013-07-19 ENCOUNTER — Ambulatory Visit (INDEPENDENT_AMBULATORY_CARE_PROVIDER_SITE_OTHER): Payer: Medicare Other | Admitting: Family Medicine

## 2013-07-19 ENCOUNTER — Encounter: Payer: Self-pay | Admitting: Family Medicine

## 2013-07-19 VITALS — BP 115/80 | HR 88 | Temp 97.0°F | Ht 68.0 in | Wt 200.0 lb

## 2013-07-19 DIAGNOSIS — L0291 Cutaneous abscess, unspecified: Secondary | ICD-10-CM

## 2013-07-19 DIAGNOSIS — L0390 Cellulitis, unspecified: Principal | ICD-10-CM

## 2013-07-19 DIAGNOSIS — R609 Edema, unspecified: Secondary | ICD-10-CM

## 2013-07-19 LAB — POCT CBC
Granulocyte percent: 64.9 %G (ref 37–80)
HCT, POC: 34.9 % — AB (ref 43.5–53.7)
Hemoglobin: 11.3 g/dL — AB (ref 14.1–18.1)
Lymph, poc: 2.3 (ref 0.6–3.4)
MCH, POC: 33.6 pg — AB (ref 27–31.2)
MCHC: 32.5 g/dL (ref 31.8–35.4)
MCV: 103.5 fL — AB (ref 80–97)
MPV: 7 fL (ref 0–99.8)
POC Granulocyte: 5.5 (ref 2–6.9)
POC LYMPH PERCENT: 27.5 %L (ref 10–50)
Platelet Count, POC: 253 10*3/uL (ref 142–424)
RBC: 3.4 M/uL — AB (ref 4.69–6.13)
RDW, POC: 14.4 %
WBC: 8.4 10*3/uL (ref 4.6–10.2)

## 2013-07-19 MED ORDER — FUROSEMIDE 80 MG PO TABS: ORAL_TABLET | ORAL | Status: DC

## 2013-07-19 NOTE — Progress Notes (Signed)
   Subjective:    Patient ID: Midge MiniumJose L Swayne, male    DOB: [birth year], 68 y.o.   MRN: [id number]  HPI This 68 y.o. male presents for evaluation of follow up from ED for cellulitis and abscess of the right Pectoralis.  He had an I&D and had packing placed.  He is doing better. He is no longer having  Fever.  He has been having swelling in his legs.  He has end stage cirrhosis and gets some Swelling.  He has been worse recently.   Review of Systems    No chest pain, SOB, HA, dizziness, vision change, N/V, diarrhea, constipation, dysuria, urinary urgency or frequency, myalgias, arthralgias or rash.  Objective:   Physical Exam  Vital signs noted  Well developed well nourished male.  HEENT - Head atraumatic Normocephalic                Eyes - PERRLA, Conjuctiva - clear Sclera- Clear EOMI Respiratory - Lungs CTA bilateral Cardiac - RRR S1 and S2 without murmur GI - Abdomen soft Nontender and bowel sounds active x 4 Extremities - pre-tibial and pedal edema bilateral Skin - Dressing from right chest removed and 2 wounds with packing present from I&D's present. Packing is removed and then new iodophor guaze is placed or packed into the wounds and then Non adaptic dressing is applied and then taped secre.      Assessment & Plan:  Cellulitis and abscess - Plan: POCT CBC  Edema - lasix 80mg  one po qd x 3 days prn severe swelling #30  Follow up in 3-4 days for wound check.  Deatra CanterWilliam J Oxford FNP

## 2013-07-20 ENCOUNTER — Emergency Department (HOSPITAL_COMMUNITY): Payer: Medicare Other

## 2013-07-20 ENCOUNTER — Inpatient Hospital Stay (HOSPITAL_COMMUNITY)
Admission: EM | Admit: 2013-07-20 | Discharge: 2013-07-23 | DRG: 092 | Disposition: A | Discharge status: Home / Self Care | Payer: Medicare Other | Attending: Family Medicine | Admitting: Family Medicine

## 2013-07-20 ENCOUNTER — Encounter (HOSPITAL_COMMUNITY): Payer: Self-pay | Admitting: Emergency Medicine

## 2013-07-20 DIAGNOSIS — K2210 Ulcer of esophagus without bleeding: Secondary | ICD-10-CM

## 2013-07-20 DIAGNOSIS — G9340 Encephalopathy, unspecified: Secondary | ICD-10-CM | POA: Diagnosis present

## 2013-07-20 DIAGNOSIS — T40605A Adverse effect of unspecified narcotics, initial encounter: Secondary | ICD-10-CM | POA: Diagnosis present

## 2013-07-20 DIAGNOSIS — G4700 Insomnia, unspecified: Secondary | ICD-10-CM | POA: Diagnosis present

## 2013-07-20 DIAGNOSIS — K3189 Other diseases of stomach and duodenum: Secondary | ICD-10-CM

## 2013-07-20 DIAGNOSIS — E871 Hypo-osmolality and hyponatremia: Secondary | ICD-10-CM | POA: Diagnosis present

## 2013-07-20 DIAGNOSIS — Z9089 Acquired absence of other organs: Secondary | ICD-10-CM

## 2013-07-20 DIAGNOSIS — E8809 Other disorders of plasma-protein metabolism, not elsewhere classified: Secondary | ICD-10-CM

## 2013-07-20 DIAGNOSIS — Z720 Tobacco use: Secondary | ICD-10-CM

## 2013-07-20 DIAGNOSIS — K921 Melena: Secondary | ICD-10-CM

## 2013-07-20 DIAGNOSIS — D500 Iron deficiency anemia secondary to blood loss (chronic): Secondary | ICD-10-CM | POA: Diagnosis present

## 2013-07-20 DIAGNOSIS — I5189 Other ill-defined heart diseases: Secondary | ICD-10-CM

## 2013-07-20 DIAGNOSIS — N5089 Other specified disorders of the male genital organs: Secondary | ICD-10-CM | POA: Diagnosis present

## 2013-07-20 DIAGNOSIS — D649 Anemia, unspecified: Secondary | ICD-10-CM

## 2013-07-20 DIAGNOSIS — L299 Pruritus, unspecified: Secondary | ICD-10-CM | POA: Diagnosis present

## 2013-07-20 DIAGNOSIS — F17200 Nicotine dependence, unspecified, uncomplicated: Secondary | ICD-10-CM | POA: Diagnosis present

## 2013-07-20 DIAGNOSIS — K254 Chronic or unspecified gastric ulcer with hemorrhage: Secondary | ICD-10-CM

## 2013-07-20 DIAGNOSIS — J209 Acute bronchitis, unspecified: Secondary | ICD-10-CM

## 2013-07-20 DIAGNOSIS — R600 Localized edema: Secondary | ICD-10-CM

## 2013-07-20 DIAGNOSIS — K7460 Unspecified cirrhosis of liver: Secondary | ICD-10-CM | POA: Diagnosis present

## 2013-07-20 DIAGNOSIS — L02213 Cutaneous abscess of chest wall: Secondary | ICD-10-CM | POA: Diagnosis present

## 2013-07-20 DIAGNOSIS — L03319 Cellulitis of trunk, unspecified: Secondary | ICD-10-CM

## 2013-07-20 DIAGNOSIS — G92 Toxic encephalopathy: Principal | ICD-10-CM | POA: Diagnosis present

## 2013-07-20 DIAGNOSIS — F1020 Alcohol dependence, uncomplicated: Secondary | ICD-10-CM | POA: Diagnosis present

## 2013-07-20 DIAGNOSIS — T450X5A Adverse effect of antiallergic and antiemetic drugs, initial encounter: Secondary | ICD-10-CM | POA: Diagnosis present

## 2013-07-20 DIAGNOSIS — R188 Other ascites: Secondary | ICD-10-CM

## 2013-07-20 DIAGNOSIS — G929 Unspecified toxic encephalopathy: Principal | ICD-10-CM | POA: Diagnosis present

## 2013-07-20 DIAGNOSIS — R222 Localized swelling, mass and lump, trunk: Secondary | ICD-10-CM

## 2013-07-20 DIAGNOSIS — K279 Peptic ulcer, site unspecified, unspecified as acute or chronic, without hemorrhage or perforation: Secondary | ICD-10-CM | POA: Diagnosis present

## 2013-07-20 DIAGNOSIS — K7030 Alcoholic cirrhosis of liver without ascites: Secondary | ICD-10-CM | POA: Diagnosis present

## 2013-07-20 DIAGNOSIS — R4182 Altered mental status, unspecified: Secondary | ICD-10-CM | POA: Insufficient documentation

## 2013-07-20 DIAGNOSIS — T887XXA Unspecified adverse effect of drug or medicament, initial encounter: Secondary | ICD-10-CM

## 2013-07-20 DIAGNOSIS — L282 Other prurigo: Secondary | ICD-10-CM | POA: Diagnosis present

## 2013-07-20 DIAGNOSIS — D62 Acute posthemorrhagic anemia: Secondary | ICD-10-CM

## 2013-07-20 DIAGNOSIS — B1920 Unspecified viral hepatitis C without hepatic coma: Secondary | ICD-10-CM | POA: Diagnosis present

## 2013-07-20 DIAGNOSIS — R609 Edema, unspecified: Secondary | ICD-10-CM

## 2013-07-20 DIAGNOSIS — A048 Other specified bacterial intestinal infections: Secondary | ICD-10-CM

## 2013-07-20 DIAGNOSIS — K766 Portal hypertension: Secondary | ICD-10-CM

## 2013-07-20 DIAGNOSIS — R768 Other specified abnormal immunological findings in serum: Secondary | ICD-10-CM

## 2013-07-20 DIAGNOSIS — L02219 Cutaneous abscess of trunk, unspecified: Secondary | ICD-10-CM | POA: Diagnosis present

## 2013-07-20 DIAGNOSIS — F1010 Alcohol abuse, uncomplicated: Secondary | ICD-10-CM

## 2013-07-20 DIAGNOSIS — R601 Generalized edema: Secondary | ICD-10-CM

## 2013-07-20 HISTORY — DX: Cutaneous abscess of chest wall: L02.213

## 2013-07-20 LAB — CBC WITH DIFFERENTIAL/PLATELET
Basophils Absolute: 0.1 10*3/uL (ref 0.0–0.1)
Basophils Relative: 1 % (ref 0–1)
EOS PCT: 8 % — AB (ref 0–5)
Eosinophils Absolute: 0.6 10*3/uL (ref 0.0–0.7)
HEMATOCRIT: 32.4 % — AB (ref 39.0–52.0)
Hemoglobin: 11.7 g/dL — ABNORMAL LOW (ref 13.0–17.0)
LYMPHS ABS: 1.9 10*3/uL (ref 0.7–4.0)
LYMPHS PCT: 25 % (ref 12–46)
MCH: 34.9 pg — ABNORMAL HIGH (ref 26.0–34.0)
MCHC: 36.1 g/dL — ABNORMAL HIGH (ref 30.0–36.0)
MCV: 96.7 fL (ref 78.0–100.0)
MONO ABS: 0.8 10*3/uL (ref 0.1–1.0)
MONOS PCT: 11 % (ref 3–12)
Neutro Abs: 4.2 10*3/uL (ref 1.7–7.7)
Neutrophils Relative %: 56 % (ref 43–77)
Platelets: 276 10*3/uL (ref 150–400)
RBC: 3.35 MIL/uL — AB (ref 4.22–5.81)
RDW: 15.1 % (ref 11.5–15.5)
WBC: 7.4 10*3/uL (ref 4.0–10.5)

## 2013-07-20 LAB — LIPASE, BLOOD: LIPASE: 34 U/L (ref 11–59)

## 2013-07-20 LAB — COMPREHENSIVE METABOLIC PANEL
ALT: 25 U/L (ref 0–53)
AST: 89 U/L — ABNORMAL HIGH (ref 0–37)
Albumin: 1.5 g/dL — ABNORMAL LOW (ref 3.5–5.2)
Alkaline Phosphatase: 128 U/L — ABNORMAL HIGH (ref 39–117)
BUN: 30 mg/dL — ABNORMAL HIGH (ref 6–23)
CO2: 25 meq/L (ref 19–32)
CREATININE: 1.06 mg/dL (ref 0.50–1.35)
Calcium: 8.2 mg/dL — ABNORMAL LOW (ref 8.4–10.5)
Chloride: 98 mEq/L (ref 96–112)
GFR calc non Af Amer: 71 mL/min — ABNORMAL LOW (ref >90)
GFR, EST AFRICAN AMERICAN: 82 mL/min — AB (ref >90)
GLUCOSE: 100 mg/dL — AB (ref 70–99)
Potassium: 4 mEq/L (ref 3.7–5.3)
Sodium: 134 mEq/L — ABNORMAL LOW (ref 137–147)
Total Bilirubin: 0.9 mg/dL (ref 0.3–1.2)
Total Protein: 6.8 g/dL (ref 6.0–8.3)

## 2013-07-20 LAB — URINALYSIS, ROUTINE W REFLEX MICROSCOPIC
Bilirubin Urine: NEGATIVE
GLUCOSE, UA: NEGATIVE mg/dL
Hgb urine dipstick: NEGATIVE
KETONES UR: NEGATIVE mg/dL
LEUKOCYTES UA: NEGATIVE
Nitrite: NEGATIVE
PROTEIN: NEGATIVE mg/dL
Specific Gravity, Urine: 1.01 (ref 1.005–1.030)
Urobilinogen, UA: 0.2 mg/dL (ref 0.0–1.0)
pH: 6 (ref 5.0–8.0)

## 2013-07-20 LAB — RAPID URINE DRUG SCREEN, HOSP PERFORMED
Amphetamines: NOT DETECTED
BARBITURATES: NOT DETECTED
BENZODIAZEPINES: NOT DETECTED
Cocaine: NOT DETECTED
Opiates: NOT DETECTED
Tetrahydrocannabinol: NOT DETECTED

## 2013-07-20 LAB — TROPONIN I: Troponin I: <0.3 ng/mL (ref <0.30)

## 2013-07-20 LAB — AMMONIA: Ammonia: 44 umol/L (ref 11–60)

## 2013-07-20 MED ORDER — TIOTROPIUM BROMIDE MONOHYDRATE 18 MCG IN CAPS
ORAL_CAPSULE | RESPIRATORY_TRACT | Status: AC
  Filled 2013-07-20: qty 5

## 2013-07-20 MED ORDER — ONDANSETRON HCL 4 MG/2ML IJ SOLN: 4.0000 mg | Freq: Four times a day (QID) | INTRAMUSCULAR | Status: DC | PRN

## 2013-07-20 MED ORDER — ONDANSETRON HCL 4 MG PO TABS: 4.0000 mg | ORAL_TABLET | Freq: Four times a day (QID) | ORAL | Status: DC | PRN

## 2013-07-20 MED ORDER — PREDNISONE 10 MG PO TABS
10.0000 mg | ORAL_TABLET | Freq: Two times a day (BID) | ORAL | Status: DC
  Administered 2013-07-21 – 2013-07-22 (×3): 10 mg via ORAL
  Filled 2013-07-20 (×3): qty 1

## 2013-07-20 MED ORDER — PANTOPRAZOLE SODIUM 40 MG PO TBEC
40.0000 mg | DELAYED_RELEASE_TABLET | Freq: Every day | ORAL | Status: DC
  Administered 2013-07-21 – 2013-07-23 (×3): 40 mg via ORAL
  Filled 2013-07-20 (×3): qty 1

## 2013-07-20 MED ORDER — ADULT MULTIVITAMIN W/MINERALS CH
1.0000 | ORAL_TABLET | Freq: Every day | ORAL | Status: DC
  Administered 2013-07-21 – 2013-07-23 (×3): 1 via ORAL
  Filled 2013-07-20 (×3): qty 1

## 2013-07-20 MED ORDER — VANCOMYCIN HCL IN DEXTROSE 1-5 GM/200ML-% IV SOLN
1000.0000 mg | Freq: Two times a day (BID) | INTRAVENOUS | Status: DC
  Administered 2013-07-20 – 2013-07-22 (×4): 1000 mg via INTRAVENOUS
  Filled 2013-07-20 (×6): qty 200

## 2013-07-20 MED ORDER — TIOTROPIUM BROMIDE MONOHYDRATE 18 MCG IN CAPS
18.0000 ug | ORAL_CAPSULE | Freq: Every day | RESPIRATORY_TRACT | Status: DC
  Administered 2013-07-20 – 2013-07-23 (×4): 18 ug via RESPIRATORY_TRACT
  Filled 2013-07-20: qty 5

## 2013-07-20 MED ORDER — SPIRONOLACTONE 25 MG PO TABS
100.0000 mg | ORAL_TABLET | Freq: Two times a day (BID) | ORAL | Status: DC
  Administered 2013-07-20 – 2013-07-21 (×2): 100 mg via ORAL
  Filled 2013-07-20 (×2): qty 4

## 2013-07-20 MED ORDER — ALUM & MAG HYDROXIDE-SIMETH 200-200-20 MG/5ML PO SUSP: 30.0000 mL | Freq: Four times a day (QID) | ORAL | Status: DC | PRN

## 2013-07-20 MED ORDER — FUROSEMIDE 40 MG PO TABS
40.0000 mg | ORAL_TABLET | Freq: Two times a day (BID) | ORAL | Status: DC
  Administered 2013-07-20 – 2013-07-21 (×2): 40 mg via ORAL
  Filled 2013-07-20 (×2): qty 1

## 2013-07-20 MED ORDER — POTASSIUM CHLORIDE IN NACL 20-0.9 MEQ/L-% IV SOLN
INTRAVENOUS | Status: DC
  Administered 2013-07-20: 21:00:00 via INTRAVENOUS

## 2013-07-20 MED ORDER — GUAIFENESIN-DM 100-10 MG/5ML PO SYRP: 5.0000 mL | ORAL_SOLUTION | ORAL | Status: DC | PRN

## 2013-07-20 MED ORDER — VANCOMYCIN HCL IN DEXTROSE 1-5 GM/200ML-% IV SOLN
INTRAVENOUS | Status: AC
  Filled 2013-07-20: qty 200

## 2013-07-20 MED ORDER — LACTULOSE 10 GM/15ML PO SOLN: 20.0000 g | Freq: Every day | ORAL | Status: DC | PRN

## 2013-07-20 MED ORDER — LEDIPASVIR-SOFOSBUVIR 90-400 MG PO TABS
1.0000 | ORAL_TABLET | Freq: Every day | ORAL | Status: DC
  Administered 2013-07-21 – 2013-07-23 (×3): 1 via ORAL
  Filled 2013-07-20 (×4): qty 1

## 2013-07-20 MED ORDER — OXYCODONE HCL 5 MG PO TABS: 5.0000 mg | ORAL_TABLET | ORAL | Status: DC | PRN

## 2013-07-20 MED ORDER — LORAZEPAM 0.5 MG PO TABS
1.5000 mg | ORAL_TABLET | Freq: Every day | ORAL | Status: DC
  Administered 2013-07-22: 1.5 mg via ORAL
  Filled 2013-07-20 (×2): qty 3

## 2013-07-20 MED ORDER — MORPHINE SULFATE 2 MG/ML IJ SOLN: 2.0000 mg | INTRAMUSCULAR | Status: DC | PRN

## 2013-07-20 MED ORDER — ALBUTEROL SULFATE (2.5 MG/3ML) 0.083% IN NEBU
3.0000 mL | INHALATION_SOLUTION | Freq: Four times a day (QID) | RESPIRATORY_TRACT | Status: DC | PRN
  Administered 2013-07-20 – 2013-07-23 (×2): 3 mL via RESPIRATORY_TRACT
  Filled 2013-07-20 (×2): qty 3

## 2013-07-20 MED ORDER — URSODIOL 250 MG PO TABS
250.0000 mg | ORAL_TABLET | Freq: Two times a day (BID) | ORAL | Status: DC
  Filled 2013-07-20 (×4): qty 1

## 2013-07-20 NOTE — ED Provider Notes (Signed)
CSN: [id number]     Arrival date & time 07/20/13  1124 History  This chart was scribed for Donnetta HutchingBrian Karell Tukes, MD by Quintella ReichertMatthew Underwood, ED scribe.  This patient was seen in room APA19/APA19 and the patient's care was started at 11:52 AM.   Chief Complaint  Patient presents with  . Altered Mental Status    The history is provided by the patient and a relative. No language interpreter was used.    Level 5 Caveat: AMS  HPI Comments: Aaron Gordon is a 68 y.o. male with h/o cirrhosis and hepatitis C who presents to the Emergency Department complaining of AMS that began yesterday with associated worsening edema.  Family states that pt has been confused, aggressive and hallucinating.  He has been arguing with family and has been talking about things that happened in the past.  Pt also presents with swelling in his legs, abdomen, scrotum and penis which he reports has been ongoing for "6 months" but family states is worse than usual.  Pt also has an abscess on his right chest which was drained in the ED several days ago.   Past Medical History  Diagnosis Date  . Stomach ulcer 1970's  . Anemia   . Erosive esophagitis 03/15/2013  . Gastric ulcer with hemorrhage 03/15/2013  . Portal hypertensive gastropathy 03/15/2013  . 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 Alcohol abuse   . Hepatitis C antibody test positive 03/15/2013  . Hypoalbuminemia 03/15/2013  . Liver cirrhosis 03/12/2013    From ETOH and Hep C  . Tobacco abuse 03/15/2013  . H. pylori infection 03/15/2013  . Diastolic dysfunction 03/15/2013    Grade 1. Ejection fraction 60%.    Past Surgical History  Procedure Laterality Date  . Splenectomy, total  1971  . Esophagogastroduodenoscopy N/A 03/13/2013    Procedure: ESOPHAGOGASTRODUODENOSCOPY (EGD);  Surgeon: Malissa HippoNajeeb U Rehman, MD;  [facility];  Service: Endoscopy;  Laterality: N/A;  . Esophagogastroduodenoscopy N/A 05/09/2013    Procedure: ESOPHAGOGASTRODUODENOSCOPY  (EGD);  Surgeon: Malissa HippoNajeeb U Rehman, MD;  [facility]: [facility];  Service: Endoscopy;  Laterality: N/A;  340    Family History  Problem Relation Age of Onset  . Cancer Mother     intestinal    History  Substance Use Topics  . Smoking status: Current Some Day Smoker -- 0.25 packs/day for 47 years    Types: Cigarettes  . Smokeless tobacco: Never Used     Comment: Patient statest he smokes 1-2 cigarettes a day  . Alcohol Use: No     Comment: former daily drinker - patient statest that he stopped 2 months ago.     Review of Systems A complete 10 system review of systems was obtained and all systems are negative except as noted in the HPI and PMH.     Allergies  Review of patient's allergies indicates no known allergies.  Home Medications   Current Outpatient Rx  Name  Route  Sig  Dispense  Refill  . albuterol (PROVENTIL HFA;VENTOLIN HFA) 108 (90 BASE) MCG/ACT inhaler   Inhalation   Inhale 2 puffs into the lungs every 6 (six) hours as needed for wheezing or shortness of breath.   1 Inhaler   2   . doxycycline (VIBRAMYCIN) 100 MG capsule   Oral   Take 1 capsule (100 mg total) by mouth 2 (two) times daily.   14 capsule   0   . furosemide (LASIX) 20 MG tablet  Oral   Take 40 mg by mouth 2 (two) times daily.         Marland Kitchen HYDROcodone-acetaminophen (NORCO/VICODIN) 5-325 MG per tablet   Oral   Take 1-2 tablets by mouth every 6 (six) hours as needed for moderate pain.   20 tablet   0   . Ledipasvir-Sofosbuvir (HARVONI) 90-400 MG TABS   Oral   Take 1 tablet by mouth daily.   28 tablet   5   . LORazepam (ATIVAN) 1 MG tablet   Oral   Take 1 tablet (1 mg total) by mouth at bedtime.   30 tablet   0   . Multiple Vitamins-Minerals (CENTRUM SILVER ADULT 50+ PO)   Oral   Take 1 tablet by mouth daily.         Marland Kitchen omeprazole (PRILOSEC) 20 MG capsule   Oral   Take 1 capsule (20 mg total) by mouth daily.   90 capsule   3   . spironolactone (ALDACTONE) 100 MG tablet    Oral   Take 1 tablet (100 mg total) by mouth 2 (two) times daily.   60 tablet   2   . tiotropium (SPIRIVA) 18 MCG inhalation capsule   Inhalation   Place 1 capsule (18 mcg total) into inhaler and inhale daily.   30 capsule   12   . triamcinolone (KENALOG) 0.025 % cream   Topical   Apply 1 application topically 2 (two) times daily. Glide 2 area of his skin rash twice daily as directed   80 g   1   . ursodiol (ACTIGALL) 250 MG tablet   Oral   Take 1 tablet (250 mg total) by mouth 2 (two) times daily.   60 tablet   1   . lactulose (CHRONULAC) 10 GM/15ML solution   Oral   Take 30 mLs (20 g total) by mouth daily as needed for mild constipation.   240 mL   0    BP 125/69  Pulse 84  Temp(Src) 97.3 F (36.3 C) (Oral)  Resp 20  SpO2 99%  Physical Exam  Nursing note and vitals reviewed. Constitutional: He appears well-developed and well-nourished.  HENT:  Head: Normocephalic and atraumatic.  Eyes: Conjunctivae and EOM are normal. Pupils are equal, round, and reactive to light.  Neck: Normal range of motion. Neck supple.  Cardiovascular: Normal rate, regular rhythm and normal heart sounds.   Pulmonary/Chest: Effort normal and breath sounds normal.  Abdominal: Soft. Bowel sounds are normal.  Musculoskeletal: Normal range of motion. He exhibits edema.  Significant 4+ peripheral edema in bilateral legs.  Extends into scrotum, penis and lower abdomen.  Neurological: He is alert.  Slightly confused but answers questions reasonably well  Skin: Skin is warm and dry.  6x4-cm area of erythema and induration on right lateral chest wall, with 2 iodoform packs.  Psychiatric: He has a normal mood and affect. His behavior is normal.    ED Course  Procedures (including critical care time)  DIAGNOSTIC STUDIES: Oxygen Saturation is 99% on room air, normal by my interpretation.    COORDINATION OF CARE: 11:58 AM-Discussed treatment plan which includes likely admission with pt and  family at bedside and they agreed to plan.     Results for orders placed during the hospital encounter of 07/20/13  CBC WITH DIFFERENTIAL      Result Value Ref Range   WBC 7.4  4.0 - 10.5 K/uL   RBC 3.35 (*) 4.22 - 5.81 MIL/uL   Hemoglobin  11.7 (*) 13.0 - 17.0 g/dL   HCT 16.1 (*) 09.6 - 04.5 %   MCV 96.7  78.0 - 100.0 fL   MCH 34.9 (*) 26.0 - 34.0 pg   MCHC 36.1 (*) 30.0 - 36.0 g/dL   RDW 40.9  81.1 - 91.4 %   Platelets 276  150 - 400 K/uL   Neutrophils Relative % 56  43 - 77 %   Neutro Abs 4.2  1.7 - 7.7 K/uL   Lymphocytes Relative 25  12 - 46 %   Lymphs Abs 1.9  0.7 - 4.0 K/uL   Monocytes Relative 11  3 - 12 %   Monocytes Absolute 0.8  0.1 - 1.0 K/uL   Eosinophils Relative 8 (*) 0 - 5 %   Eosinophils Absolute 0.6  0.0 - 0.7 K/uL   Basophils Relative 1  0 - 1 %   Basophils Absolute 0.1  0.0 - 0.1 K/uL  COMPREHENSIVE METABOLIC PANEL      Result Value Ref Range   Sodium 134 (*) 137 - 147 mEq/L   Potassium 4.0  3.7 - 5.3 mEq/L   Chloride 98  96 - 112 mEq/L   CO2 25  19 - 32 mEq/L   Glucose, Bld 100 (*) 70 - 99 mg/dL   BUN 30 (*) 6 - 23 mg/dL   Creatinine, Ser 7.82  0.50 - 1.35 mg/dL   Calcium 8.2 (*) 8.4 - 10.5 mg/dL   Total Protein 6.8  6.0 - 8.3 g/dL   Albumin 1.5 (*) 3.5 - 5.2 g/dL   AST 89 (*) 0 - 37 U/L   ALT 25  0 - 53 U/L   Alkaline Phosphatase 128 (*) 39 - 117 U/L   Total Bilirubin 0.9  0.3 - 1.2 mg/dL   GFR calc non Af Amer 71 (*) >90 mL/min   GFR calc Af Amer 82 (*) >90 mL/min  TROPONIN I      Result Value Ref Range   Troponin I <0.30  <0.30 ng/mL  LIPASE, BLOOD      Result Value Ref Range   Lipase 34  11 - 59 U/L  URINALYSIS, ROUTINE W REFLEX MICROSCOPIC      Result Value Ref Range   Color, Urine YELLOW  YELLOW   APPearance CLEAR  CLEAR   Specific Gravity, Urine 1.010  1.005 - 1.030   pH 6.0  5.0 - 8.0   Glucose, UA NEGATIVE  NEGATIVE mg/dL   Hgb urine dipstick NEGATIVE  NEGATIVE   Bilirubin Urine NEGATIVE  NEGATIVE   Ketones, ur NEGATIVE  NEGATIVE  mg/dL   Protein, ur NEGATIVE  NEGATIVE mg/dL   Urobilinogen, UA 0.2  0.0 - 1.0 mg/dL   Nitrite NEGATIVE  NEGATIVE   Leukocytes, UA NEGATIVE  NEGATIVE  AMMONIA      Result Value Ref Range   Ammonia 44  11 - 60 umol/L    Ct Head Wo Contrast  07/20/2013   CLINICAL DATA:  Altered mental status, history cirrhosis, smoking  EXAM: CT HEAD WITHOUT CONTRAST  TECHNIQUE: Contiguous axial images were obtained from the base of the skull through the vertex without intravenous contrast.  COMPARISON:  None  FINDINGS: Generalized atrophy.  Normal ventricular morphology.  No midline shift or mass effect.  Small vessel chronic ischemic changes in deep cerebral white matter.  Otherwise normal appearance of brain parenchyma.  No intracranial hemorrhage, mass lesion or evidence acute infarction.  No extra-axial fluid collections.  Bones demineralized but otherwise unremarkable.  Few atherosclerotic calcifications at skullbase.  Paranasal sinuses clear.  IMPRESSION: Atrophy with minimal small vessel chronic ischemic changes of deep cerebral white matter.  No acute intracranial abnormalities.   Electronically Signed   By: Ulyses Southward M.D.   On: 07/20/2013 15:15    Dg Chest Portable 1 View  07/20/2013   CLINICAL DATA:  Altered mental status  EXAM: PORTABLE CHEST - 1 VIEW  COMPARISON:  DG ABD ACUTE W/CHEST dated 03/23/2013  FINDINGS: Right hemidiaphragm remains elevated with basilar atelectasis. Left lung is clear. Normal heart size. No pneumothorax.  IMPRESSION: No active cardiopulmonary disease. Stable chronic changes at the right lung base.   Electronically Signed   By: Maryclare Bean M.D.   On: 07/20/2013 12:54      EKG Interpretation   Date/Time:  Saturday July 20 2013 12:29:25 EDT Ventricular Rate:  78 PR Interval:  158 QRS Duration: 88 QT Interval:  444 QTC Calculation: 506 R Axis:   -30 Text Interpretation:  Normal sinus rhythm Left axis deviation Low voltage  QRS Abnormal ECG When compared with ECG of  22-Mar-2013 23:48, Nonspecific  T wave abnormality now evident in Anterior leads Confirmed by Adriana Simas  MD,  Malikai Gut ([id number]) on 07/20/2013 1:16:31 PM      MDM   Final diagnoses:  Altered mental status    Uncertain etiology of patient's altered mental status. CT head shows no acute changes. Urinalysis negative. No evidence of pneumonia. Lower extremity and genitourinary edema is long-standing. Ammonia level normal. Admit to general medicine    I personally performed the services described in this documentation, which was scribed in my presence. The recorded information has been reviewed and is accurate.     Donnetta Hutching, MD 07/20/13 [phone]

## 2013-07-20 NOTE — ED Notes (Addendum)
Pt presents to er with family for further evaluation of aggression, hallucinations, talking about things that have happened in the past, family member states that pt started having a change in his mental status yesterday, has been very aggressive, arguing with family member, did have a fall last night, denies any injury, pt was eating cookies upon arrival to triage, when asked pt stop eating the cookies, pt tone of voice became aggressive, rationale explained to pt, pt states "I am just putting them up", pt able to answer where he is, who he is, family member with him but rambles in conversation at times during triage, attempting to take his shirt off in triage room, pt also complains of swelling to his penis area, admits to a 10 lb weight gain over the past few days,

## 2013-07-20 NOTE — Progress Notes (Signed)
ANTIBIOTIC CONSULT NOTE - INITIAL  Pharmacy Consult for Vancomycin Indication: Chest wall abscess  No Known Allergies  Patient Measurements: Height: 5\' 8"  (172.7 cm) Weight: 196 lb 8 oz (89.132 kg) IBW/kg (Calculated) : 68.4 Adjusted Body Weight:   Vital Signs: Temp: 97.5 F (36.4 C) (03/21 1700) Temp src: Oral (03/21 1700) BP: 129/88 mmHg (03/21 1700) Pulse Rate: 85 (03/21 1700) Intake/Output from previous day:   Intake/Output from this shift:    Labs:  Recent Labs  07/19/13 1432 07/20/13 1218  WBC 8.4 7.4  HGB 11.3* 11.7*  PLT  --  276  CREATININE  --  1.06   Estimated Creatinine Clearance: 73.4 ml/min (by C-G formula based on Cr of 1.06). No results found for this basename: VANCOTROUGH, VANCOPEAK, VANCORANDOM, GENTTROUGH, GENTPEAK, GENTRANDOM, TOBRATROUGH, TOBRAPEAK, TOBRARND, AMIKACINPEAK, AMIKACINTROU, AMIKACIN,  in the last 72 hours   Microbiology: No results found for this or any previous visit (from the past 720 hour(s)).  Medical History: Past Medical History  Diagnosis Date  . Stomach ulcer 1970's  . Anemia   . Erosive esophagitis 03/15/2013  . Gastric ulcer with hemorrhage 03/15/2013  . Portal hypertensive gastropathy 03/15/2013  . 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 Alcohol abuse   . Hepatitis C antibody test positive 03/15/2013  . Hypoalbuminemia 03/15/2013  . Liver cirrhosis 03/12/2013    From ETOH and Hep C  . Tobacco abuse 03/15/2013  . H. pylori infection 03/15/2013  . Diastolic dysfunction 03/15/2013    Grade 1. Ejection fraction 60%.  . Abscess of chest wall 06/2013    Medications:  Scheduled:  . furosemide  40 mg Oral BID  . [START ON 07/21/2013] Ledipasvir-Sofosbuvir  1 tablet Oral Daily  . LORazepam  1.5 mg Oral QHS  . [START ON 07/21/2013] multivitamin with minerals  1 tablet Oral Daily  . [START ON 07/21/2013] pantoprazole  40 mg Oral Daily  . [START ON 07/21/2013] predniSONE  10 mg Oral BID WC  . spironolactone  100  mg Oral BID  . tiotropium  18 mcg Inhalation Daily  . ursodiol  250 mg Oral BID  . vancomycin  1,000 mg Intravenous Q12H   Assessment: Chest wall abscess, drained in ED several days ago   Goal of Therapy:  Vancomycin trough level 15-20 mcg/ml  Plan:  Vancomycin 1 GM IV every 12 hours Vancomycin trough at steady state Monitor renal function Labs per protocol  Aaron Gordon, Aaron Gordon 07/20/2013,8:09 PM

## 2013-07-20 NOTE — H&P (Signed)
[facility] History and Physical  Aaron Gordon RUE:[id number] DOB: [birth year] DOA: 07/20/2013  Referring physician: ED physician, Dr. Adriana Simas PCP: Rudi Heap, MD   Chief Complaint: Confusion  HPI: Aaron Gordon is a 68 y.o. male with a history of hepatitis C and alcohol-induced cirrhosis, chronic anasarca, peptic ulcer disease, and diastolic dysfunction. He presents with confusion according to his common law wife, Aaron Gordon. The patient was recently treated in the emergency department on 07/17/2013 with an abscess of his right chest wall. Apparently, it was incised and drained. He was prescribed doxycycline and hydrocodone. Previously, he had been taking Benadryl twice daily for one week for a pruritic rash. Aaron Gordon says that he does not sleep well and he has not gotten a good night sleep in months. Last night, the patient began hallucinating and talking nonstop and "dragging stuff out of the closet". The confusion lasted until this morning. She denies any witnessed facial droop, slurred speech, or unilateral weakness of his extremities. The patient says that he is not confused, but he has difficulty sleeping. He also complains of the itchy rash that he has had on and off for months and he says that the cream (triamterene) has not helped much with the itching. He does not recall having any ill effects to doxycycline or hydrocodone in the past. He does not recall having any side effects to Benadryl either.  In the emergency department, he is afebrile and hemodynamically stable. His lab data are significant for a hemoglobin of 11.7, sodium of 134, BUN of 30, and glucose of 100. His lipase is within normal limits at 34. His AST is slightly elevated at 89. His ammonia level is normal at 44. CT of his head reveals no acute intracranial abnormalities, but with atrophy and small vessel ischemic changes. His chest x-ray reveals no active cardiopulmonary disease. He is being admitted for further evaluation and  management.     Review of Systems:  As above in history present illness. In addition, he has chronic abdominal swelling, chronic swelling in his legs, occasional fatigue, and itchy rash and confusion as mentioned above. Otherwise review of systems is negative.   Past Medical History  Diagnosis Date  . Stomach ulcer 1970's  . Anemia   . Erosive esophagitis 03/15/2013  . Gastric ulcer with hemorrhage 03/15/2013  . Portal hypertensive gastropathy 03/15/2013  . 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 Alcohol abuse   . Hepatitis C antibody test positive 03/15/2013  . Hypoalbuminemia 03/15/2013  . Liver cirrhosis 03/12/2013    From ETOH and Hep C  . Tobacco abuse 03/15/2013  . H. pylori infection 03/15/2013  . Diastolic dysfunction 03/15/2013    Grade 1. Ejection fraction 60%.  . Abscess of chest wall 06/2013   Past Surgical History  Procedure Laterality Date  . Splenectomy, total  1971  . Esophagogastroduodenoscopy N/A 03/13/2013    Procedure: ESOPHAGOGASTRODUODENOSCOPY (EGD);  Surgeon: Malissa Hippo, MD;  Location: [facility];  Service: Endoscopy;  Laterality: N/A;  . Esophagogastroduodenoscopy N/A 05/09/2013    Procedure: ESOPHAGOGASTRODUODENOSCOPY (EGD);  Surgeon: Malissa Hippo, MD;  Location: [facility];  Service: Endoscopy;  Laterality: N/A;  340   Social History: The patient lives with his common-law wife, Aaron Gordon. He has an adopted son. He smokes 3-4 cigarettes daily, down from one pack a day. He stopped drinking October 2014. He denies illicit drug use.    No Known Allergies  Family History  Problem Relation Age of  Onset  . Cancer Mother     intestinal     Prior to Admission medications   Medication Sig Start Date End Date Taking? Authorizing Provider  albuterol (PROVENTIL HFA;VENTOLIN HFA) 108 (90 BASE) MCG/ACT inhaler Inhale 2 puffs into the lungs every 6 (six) hours as needed for wheezing or shortness of breath. 05/09/13  Yes Malissa Hippo, MD    doxycycline (VIBRAMYCIN) 100 MG capsule Take 1 capsule (100 mg total) by mouth 2 (two) times daily. 07/17/13  Yes Shelda Jakes, MD  furosemide (LASIX) 20 MG tablet Take 40 mg by mouth 2 (two) times daily.   Yes Historical Provider, MD  HYDROcodone-acetaminophen (NORCO/VICODIN) 5-325 MG per tablet Take 1-2 tablets by mouth every 6 (six) hours as needed for moderate pain. 07/17/13  Yes Shelda Jakes, MD  Ledipasvir-Sofosbuvir (HARVONI) 90-400 MG TABS Take 1 tablet by mouth daily. 06/11/13  Yes Malissa Hippo, MD  LORazepam (ATIVAN) 1 MG tablet Take 1 tablet (1 mg total) by mouth at bedtime. 07/03/13  Yes Malissa Hippo, MD  Multiple Vitamins-Minerals (CENTRUM SILVER ADULT 50+ PO) Take 1 tablet by mouth daily.   Yes Historical Provider, MD  omeprazole (PRILOSEC) 20 MG capsule Take 1 capsule (20 mg total) by mouth daily. 06/11/13  Yes Malissa Hippo, MD  spironolactone (ALDACTONE) 100 MG tablet Take 1 tablet (100 mg total) by mouth 2 (two) times daily. 07/09/13  Yes Len Blalock, NP  tiotropium (SPIRIVA) 18 MCG inhalation capsule Place 1 capsule (18 mcg total) into inhaler and inhale daily. 05/30/13  Yes Lesle Chris Black, NP  triamcinolone (KENALOG) 0.025 % cream Apply 1 application topically 2 (two) times daily. Glide 2 area of his skin rash twice daily as directed 07/03/13  Yes Malissa Hippo, MD  ursodiol (ACTIGALL) 250 MG tablet Take 1 tablet (250 mg total) by mouth 2 (two) times daily. 07/03/13  Yes Malissa Hippo, MD  lactulose (CHRONULAC) 10 GM/15ML solution Take 30 mLs (20 g total) by mouth daily as needed for mild constipation. 06/11/13   Malissa Hippo, MD   Physical Exam: Filed Vitals:   07/20/13 1700  BP: 129/88  Pulse: 85  Temp: 97.5 F (36.4 C)  Resp: 18    BP 129/88  Pulse 85  Temp(Src) 97.5 F (36.4 C) (Oral)  Resp 18  Ht 5\' 8"  (1.727 m)  Wt 89.132 kg (196 lb 8 oz)  BMI 29.88 kg/m2  SpO2 99%  General:  Appears calm and comfortable; no acute distress. Eyes: PERRL,  normal lids, irises & conjunctiva ENT: grossly normal hearing, lips & tongue Neck: no LAD, masses or thyromegaly Cardiovascular: RRR, no m/r/g. 3-4+ bilateral LE edema. Telemetry: SR, no arrhythmias  Respiratory: CTA bilaterally, no w/r/r. Normal respiratory effort. Abdomen: Soft, mild ascites, well-healed vertical scar, nontender, nondistended. No masses palpated. Skin: Scattered excoriated lesions, query rash over his torso, arms, and back. 2 small open areas/wounds right lateral chest wall with surrounding erythema and induration and small amount of seropurulent drainage; mildly tender. Musculoskeletal: grossly normal tone BUE/BLE Psychiatric: The patient's speech is clear. He is alert and oriented to himself, hospital, year, month, and president. Neurologic: grossly non-focal.no asterixis. Cranial nerves II through XII are intact.           Labs on Admission:  Basic Metabolic Panel:  Recent Labs Lab 07/17/13 1202 07/20/13 1218  NA 133* 134*  K 3.8 4.0  CL 97 98  CO2 26 25  GLUCOSE 107* 100*  BUN  23 30*  CREATININE 0.89 1.06  CALCIUM 8.1* 8.2*   Liver Function Tests:  Recent Labs Lab 07/17/13 1202 07/20/13 1218  AST 49* 89*  ALT 18 25  ALKPHOS 148* 128*  BILITOT 1.3* 0.9  PROT 7.4 6.8  ALBUMIN 1.4* 1.5*    Recent Labs Lab 07/20/13 1218  LIPASE 34    Recent Labs Lab 07/20/13 1218  AMMONIA 44   CBC:  Recent Labs Lab 07/17/13 1202 07/19/13 1432 07/20/13 1218  WBC 10.1 8.4 7.4  NEUTROABS 6.9  --  4.2  HGB 11.9* 11.3* 11.7*  HCT 33.6* 34.9* 32.4*  MCV 98.0 103.5* 96.7  PLT 249  --  276   Cardiac Enzymes:  Recent Labs Lab 07/20/13 1218  TROPONINI <0.30    BNP (last 3 results) No results found for this basename: PROBNP,  in the last 8760 hours CBG: No results found for this basename: GLUCAP,  in the last 168 hours  Radiological Exams on Admission: Ct Head Wo Contrast  07/20/2013   CLINICAL DATA:  Altered mental status, history cirrhosis,  smoking  EXAM: CT HEAD WITHOUT CONTRAST  TECHNIQUE: Contiguous axial images were obtained from the base of the skull through the vertex without intravenous contrast.  COMPARISON:  None  FINDINGS: Generalized atrophy.  Normal ventricular morphology.  No midline shift or mass effect.  Small vessel chronic ischemic changes in deep cerebral white matter.  Otherwise normal appearance of brain parenchyma.  No intracranial hemorrhage, mass lesion or evidence acute infarction.  No extra-axial fluid collections.  Bones demineralized but otherwise unremarkable.  Few atherosclerotic calcifications at skullbase.  Paranasal sinuses clear.  IMPRESSION: Atrophy with minimal small vessel chronic ischemic changes of deep cerebral white matter.  No acute intracranial abnormalities.   Electronically Signed   By: Ulyses Southward M.D.   On: 07/20/2013 15:15   Dg Chest Portable 1 View  07/20/2013   CLINICAL DATA:  Altered mental status  EXAM: PORTABLE CHEST - 1 VIEW  COMPARISON:  DG ABD ACUTE W/CHEST dated 03/23/2013  FINDINGS: Right hemidiaphragm remains elevated with basilar atelectasis. Left lung is clear. Normal heart size. No pneumothorax.  IMPRESSION: No active cardiopulmonary disease. Stable chronic changes at the right lung base.   Electronically Signed   By: Maryclare Bean M.D.   On: 07/20/2013 12:54    EKG: Independently reviewed. Normal sinus rhythm, artifact, heart rate 70 beats per minute  Assessment/Plan Principal Problem:   Acute encephalopathy Active Problems:   Liver cirrhosis   Anasarca   Medication side effect   Abscess of chest wall   Pruritic rash   Insomnia   Anemia due to chronic blood loss   Peptic ulcer disease   1. This is a 68 year old man with a known history of hepatic cirrhosis from hepatitis C and previous alcohol use, chronic anasarca, and anemia due to chronic blood loss. All of these medical conditions appear to be stable. His common-law wife reports confusion and hallucinations over the  past 24 hours, but the patient appears to be appropriate and not encephalopathic currently. His ammonia level is within normal limits which is not consistent with hepatic encephalopathy. CT scan of his head is nonacute. He has no asterixis on exam. He has no noted neurological findings. Medication side effect could be a potential etiology, namely Benadryl. Apparently he has been taking Benadryl for chronic itching for the past week which is new for him according to [place]. This also coupled with possible hydrocodone use for pain and chronic insomnia.  He has a right lateral wall abscess which was I&D'd in the ED on 07/17/13. It is still draining and quite indurated and mildly cellulitic. He had been prescribed doxycycline which he had been taking according to [place]. He is neither febrile nor does he have an elevated white blood cell count. Nevertheless, I believe a general surgeon consultation would be beneficial along with IV vancomycin.     Plan: 1. Will discontinue Benadryl, Vicodin, and doxycycline. 2. We'll discontinue topical steroid treatment. 3. Will add small dosing of prednisone to help with itching. Continue Ursodiol and all his other chronic cirrhosis medications. 4. Start IV vancomycin for treatment of the right chest wall abscess. We'll consult general surgery for further management recommendations. 5. We'll continue Lasix at a slightly higher dose of 60 mg twice a day, up from 40 mg twice a day and continue Aldactone at 100 mg daily. 6. We'll increase lorazepam to 1.5 mg up from 1 mg each bedtime to help with treatment of insomnia. 7. Consider consultation with GI, Dr. Karilyn Cotaehman regarding chronic treatment of anasarca and cirrhosis.  Code Status: Full code Family Communication: Discussed with his common-law wife, Aaron PikesSusan. Disposition Plan: Anticipate discharge to home in one to 3 days.  Time spent: One hour and 15 minutes.  [place] ElkFISHER,Recia Sons [facility] Pager [phone]

## 2013-07-21 DIAGNOSIS — R4182 Altered mental status, unspecified: Secondary | ICD-10-CM

## 2013-07-21 LAB — COMPREHENSIVE METABOLIC PANEL
ALK PHOS: 121 U/L — AB (ref 39–117)
ALT: 24 U/L (ref 0–53)
AST: 87 U/L — ABNORMAL HIGH (ref 0–37)
Albumin: 1.2 g/dL — ABNORMAL LOW (ref 3.5–5.2)
BUN: 25 mg/dL — AB (ref 6–23)
CO2: 23 meq/L (ref 19–32)
Calcium: 7.7 mg/dL — ABNORMAL LOW (ref 8.4–10.5)
Chloride: 98 mEq/L (ref 96–112)
Creatinine, Ser: 1 mg/dL (ref 0.50–1.35)
GFR, EST AFRICAN AMERICAN: 88 mL/min — AB (ref >90)
GFR, EST NON AFRICAN AMERICAN: 76 mL/min — AB (ref >90)
GLUCOSE: 98 mg/dL (ref 70–99)
Potassium: 4.2 mEq/L (ref 3.7–5.3)
Sodium: 132 mEq/L — ABNORMAL LOW (ref 137–147)
Total Bilirubin: 0.8 mg/dL (ref 0.3–1.2)
Total Protein: 5.8 g/dL — ABNORMAL LOW (ref 6.0–8.3)

## 2013-07-21 LAB — CBC
HEMATOCRIT: 28.5 % — AB (ref 39.0–52.0)
Hemoglobin: 10.4 g/dL — ABNORMAL LOW (ref 13.0–17.0)
MCH: 35.1 pg — ABNORMAL HIGH (ref 26.0–34.0)
MCHC: 36.5 g/dL — AB (ref 30.0–36.0)
MCV: 96.3 fL (ref 78.0–100.0)
Platelets: 244 10*3/uL (ref 150–400)
RBC: 2.96 MIL/uL — ABNORMAL LOW (ref 4.22–5.81)
RDW: 15.2 % (ref 11.5–15.5)
WBC: 7.8 10*3/uL (ref 4.0–10.5)

## 2013-07-21 LAB — PROTIME-INR
INR: 1.45 (ref 0.00–1.49)
PROTHROMBIN TIME: 17.3 s — AB (ref 11.6–15.2)

## 2013-07-21 MED ORDER — METOLAZONE 5 MG PO TABS
5.0000 mg | ORAL_TABLET | Freq: Every day | ORAL | Status: DC
  Administered 2013-07-21 – 2013-07-23 (×3): 5 mg via ORAL
  Filled 2013-07-21 (×3): qty 1

## 2013-07-21 MED ORDER — LACTULOSE 10 GM/15ML PO SOLN
20.0000 g | Freq: Three times a day (TID) | ORAL | Status: DC
  Administered 2013-07-21 – 2013-07-23 (×6): 20 g via ORAL
  Filled 2013-07-21 (×6): qty 30

## 2013-07-21 MED ORDER — ALBUMIN HUMAN 25 % IV SOLN
25.0000 g | Freq: Once | INTRAVENOUS | Status: AC
  Administered 2013-07-21: 25 g via INTRAVENOUS
  Filled 2013-07-21: qty 100

## 2013-07-21 MED ORDER — URSODIOL 500 MG PO TABS
250.0000 mg | ORAL_TABLET | Freq: Two times a day (BID) | ORAL | Status: DC
  Administered 2013-07-21 – 2013-07-23 (×5): 250 mg via ORAL
  Filled 2013-07-21 (×10): qty 1

## 2013-07-21 MED ORDER — DEXTROSE 5 % IV SOLN
1.0000 g | INTRAVENOUS | Status: DC
  Administered 2013-07-21: 1 g via INTRAVENOUS
  Filled 2013-07-21 (×2): qty 10

## 2013-07-21 NOTE — Consult Note (Signed)
Reason for Consult: Right breast abscess Referring Physician: [facility]  Aaron Gordon is an 68 y.o. male.  HPI: Patient is a 68 year old Hispanic male with multiple medical problems including liver cirrhosis from ethanol use and hepatitis C who presented with an abscess in the right breast. This was incised and drained in the emergency room. He was removed the hospital for further evaluation treatment. He states his right breast does not hurt. He denies any fevers.  Past Medical History  Diagnosis Date  . Stomach ulcer 1970's  . Anemia   . Erosive esophagitis 03/15/2013  . Gastric ulcer with hemorrhage 03/15/2013  . Portal hypertensive gastropathy 03/15/2013  . 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 Alcohol abuse   . Hepatitis C antibody test positive 03/15/2013  . Hypoalbuminemia 03/15/2013  . Liver cirrhosis 03/12/2013    From ETOH and Hep C  . Tobacco abuse 03/15/2013  . H. pylori infection 03/15/2013  . Diastolic dysfunction 50/07/7046    Grade 1. Ejection fraction 60%.  . Abscess of chest wall 06/2013    Past Surgical History  Procedure Laterality Date  . Splenectomy, total  1971  . Esophagogastroduodenoscopy N/A 03/13/2013    Procedure: ESOPHAGOGASTRODUODENOSCOPY (EGD);  Surgeon: Rogene Houston, MD;  Location: [facility];  Service: Endoscopy;  Laterality: N/A;  . Esophagogastroduodenoscopy N/A 05/09/2013    Procedure: ESOPHAGOGASTRODUODENOSCOPY (EGD);  Surgeon: Rogene Houston, MD;  Location: [facility];  Service: Endoscopy;  Laterality: N/A;  340    Family History  Problem Relation Age of Onset  . Cancer Mother     intestinal    Social History:  reports that he has been smoking Cigarettes.  He has a 11.75 pack-year smoking history. He has never used smokeless tobacco. He reports that he does not drink alcohol or use illicit drugs.  Allergies: No Known Allergies  Medications: I have reviewed the patient's current medications.  Results for  orders placed during the hospital encounter of 07/20/13 (from the past 48 hour(s))  CBC WITH DIFFERENTIAL     Status: Abnormal   Collection Time    07/20/13 12:18 PM      Result Value Ref Range   WBC 7.4  4.0 - 10.5 K/uL   RBC 3.35 (*) 4.22 - 5.81 MIL/uL   Hemoglobin 11.7 (*) 13.0 - 17.0 g/dL   HCT 32.4 (*) 39.0 - 52.0 %   MCV 96.7  78.0 - 100.0 fL   MCH 34.9 (*) 26.0 - 34.0 pg   MCHC 36.1 (*) 30.0 - 36.0 g/dL   RDW 15.1  11.5 - 15.5 %   Platelets 276  150 - 400 K/uL   Neutrophils Relative % 56  43 - 77 %   Neutro Abs 4.2  1.7 - 7.7 K/uL   Lymphocytes Relative 25  12 - 46 %   Lymphs Abs 1.9  0.7 - 4.0 K/uL   Monocytes Relative 11  3 - 12 %   Monocytes Absolute 0.8  0.1 - 1.0 K/uL   Eosinophils Relative 8 (*) 0 - 5 %   Eosinophils Absolute 0.6  0.0 - 0.7 K/uL   Basophils Relative 1  0 - 1 %   Basophils Absolute 0.1  0.0 - 0.1 K/uL  COMPREHENSIVE METABOLIC PANEL     Status: Abnormal   Collection Time    07/20/13 12:18 PM      Result Value Ref Range   Sodium 134 (*) 137 - 147 mEq/L  Potassium 4.0  3.7 - 5.3 mEq/L   Chloride 98  96 - 112 mEq/L   CO2 25  19 - 32 mEq/L   Glucose, Bld 100 (*) 70 - 99 mg/dL   BUN 30 (*) 6 - 23 mg/dL   Creatinine, Ser 1.06  0.50 - 1.35 mg/dL   Calcium 8.2 (*) 8.4 - 10.5 mg/dL   Total Protein 6.8  6.0 - 8.3 g/dL   Albumin 1.5 (*) 3.5 - 5.2 g/dL   AST 89 (*) 0 - 37 U/L   ALT 25  0 - 53 U/L   Alkaline Phosphatase 128 (*) 39 - 117 U/L   Total Bilirubin 0.9  0.3 - 1.2 mg/dL   GFR calc non Af Amer 71 (*) >90 mL/min   GFR calc Af Amer 82 (*) >90 mL/min   Comment: (NOTE)     The eGFR has been calculated using the CKD EPI equation.     This calculation has not been validated in all clinical situations.     eGFR's persistently <90 mL/min signify possible Chronic Kidney     Disease.  TROPONIN I     Status: None   Collection Time    07/20/13 12:18 PM      Result Value Ref Range   Troponin I <0.30  <0.30 ng/mL   Comment:            Due to the  release kinetics of cTnI,     a negative result within the first hours     of the onset of symptoms does not rule out     myocardial infarction with certainty.     If myocardial infarction is still suspected,     repeat the test at appropriate intervals.  LIPASE, BLOOD     Status: None   Collection Time    07/20/13 12:18 PM      Result Value Ref Range   Lipase 34  11 - 59 U/L  AMMONIA     Status: None   Collection Time    07/20/13 12:18 PM      Result Value Ref Range   Ammonia 44  11 - 60 umol/L  URINALYSIS, ROUTINE W REFLEX MICROSCOPIC     Status: None   Collection Time    07/20/13  1:35 PM      Result Value Ref Range   Color, Urine YELLOW  YELLOW   APPearance CLEAR  CLEAR   Specific Gravity, Urine 1.010  1.005 - 1.030   pH 6.0  5.0 - 8.0   Glucose, UA NEGATIVE  NEGATIVE mg/dL   Hgb urine dipstick NEGATIVE  NEGATIVE   Bilirubin Urine NEGATIVE  NEGATIVE   Ketones, ur NEGATIVE  NEGATIVE mg/dL   Protein, ur NEGATIVE  NEGATIVE mg/dL   Urobilinogen, UA 0.2  0.0 - 1.0 mg/dL   Nitrite NEGATIVE  NEGATIVE   Leukocytes, UA NEGATIVE  NEGATIVE   Comment: MICROSCOPIC NOT DONE ON URINES WITH NEGATIVE PROTEIN, BLOOD, LEUKOCYTES, NITRITE, OR GLUCOSE <1000 mg/dL.  URINE RAPID DRUG SCREEN (HOSP PERFORMED)     Status: None   Collection Time    07/20/13  1:35 PM      Result Value Ref Range   Opiates NONE DETECTED  NONE DETECTED   Cocaine NONE DETECTED  NONE DETECTED   Benzodiazepines NONE DETECTED  NONE DETECTED   Amphetamines NONE DETECTED  NONE DETECTED   Tetrahydrocannabinol NONE DETECTED  NONE DETECTED   Barbiturates NONE DETECTED  NONE DETECTED   Comment:  DRUG SCREEN FOR MEDICAL PURPOSES     ONLY.  IF CONFIRMATION IS NEEDED     FOR ANY PURPOSE, NOTIFY LAB     WITHIN 5 DAYS.                LOWEST DETECTABLE LIMITS     FOR URINE DRUG SCREEN     Drug Class       Cutoff (ng/mL)     Amphetamine      1000     Barbiturate      200     Benzodiazepine   027     Tricyclics        253     Opiates          300     Cocaine          300     THC              50  COMPREHENSIVE METABOLIC PANEL     Status: Abnormal   Collection Time    07/21/13  5:59 AM      Result Value Ref Range   Sodium 132 (*) 137 - 147 mEq/L   Potassium 4.2  3.7 - 5.3 mEq/L   Chloride 98  96 - 112 mEq/L   CO2 23  19 - 32 mEq/L   Glucose, Bld 98  70 - 99 mg/dL   BUN 25 (*) 6 - 23 mg/dL   Creatinine, Ser 1.00  0.50 - 1.35 mg/dL   Calcium 7.7 (*) 8.4 - 10.5 mg/dL   Total Protein 5.8 (*) 6.0 - 8.3 g/dL   Albumin 1.2 (*) 3.5 - 5.2 g/dL   AST 87 (*) 0 - 37 U/L   ALT 24  0 - 53 U/L   Alkaline Phosphatase 121 (*) 39 - 117 U/L   Total Bilirubin 0.8  0.3 - 1.2 mg/dL   GFR calc non Af Amer 76 (*) >90 mL/min   GFR calc Af Amer 88 (*) >90 mL/min   Comment: (NOTE)     The eGFR has been calculated using the CKD EPI equation.     This calculation has not been validated in all clinical situations.     eGFR's persistently <90 mL/min signify possible Chronic Kidney     Disease.  CBC     Status: Abnormal   Collection Time    07/21/13  5:59 AM      Result Value Ref Range   WBC 7.8  4.0 - 10.5 K/uL   RBC 2.96 (*) 4.22 - 5.81 MIL/uL   Hemoglobin 10.4 (*) 13.0 - 17.0 g/dL   HCT 28.5 (*) 39.0 - 52.0 %   MCV 96.3  78.0 - 100.0 fL   MCH 35.1 (*) 26.0 - 34.0 pg   MCHC 36.5 (*) 30.0 - 36.0 g/dL   RDW 15.2  11.5 - 15.5 %   Platelets 244  150 - 400 K/uL  PROTIME-INR     Status: Abnormal   Collection Time    07/21/13  5:59 AM      Result Value Ref Range   Prothrombin Time 17.3 (*) 11.6 - 15.2 seconds   INR 1.45  0.00 - 1.49    Ct Head Wo Contrast  07/20/2013   CLINICAL DATA:  Altered mental status, history cirrhosis, smoking  EXAM: CT HEAD WITHOUT CONTRAST  TECHNIQUE: Contiguous axial images were obtained from the base of the skull through the vertex without intravenous contrast.  COMPARISON:  None  FINDINGS:  Generalized atrophy.  Normal ventricular morphology.  No midline shift or mass effect.  Small  vessel chronic ischemic changes in deep cerebral white matter.  Otherwise normal appearance of brain parenchyma.  No intracranial hemorrhage, mass lesion or evidence acute infarction.  No extra-axial fluid collections.  Bones demineralized but otherwise unremarkable.  Few atherosclerotic calcifications at skullbase.  Paranasal sinuses clear.  IMPRESSION: Atrophy with minimal small vessel chronic ischemic changes of deep cerebral white matter.  No acute intracranial abnormalities.   Electronically Signed   By: Lavonia Dana M.D.   On: 07/20/2013 15:15   Dg Chest Portable 1 View  07/20/2013   CLINICAL DATA:  Altered mental status  EXAM: PORTABLE CHEST - 1 VIEW  COMPARISON:  DG ABD ACUTE W/CHEST dated 03/23/2013  FINDINGS: Right hemidiaphragm remains elevated with basilar atelectasis. Left lung is clear. Normal heart size. No pneumothorax.  IMPRESSION: No active cardiopulmonary disease. Stable chronic changes at the right lung base.   Electronically Signed   By: Maryclare Bean M.D.   On: 07/20/2013 12:54    ROS: See chart Blood pressure 120/80, pulse 85, temperature 97.8 F (36.6 C), temperature source Oral, resp. rate 20, height _0  (1.727 m), weight 91.9 kg (202 lb 9.6 oz), SpO2 98.00%. Physical Exam: Pleasant Hispanic male no acute distress. Right breast with packed wound in the upper, outer quadrant of the right breast. No significant erythema or induration is noted. Nurse reports that there was minimal purulent drainage on the dressing changed this morning.  Assessment/Plan: Impression: Right breast abscess, resolving Plan: No need for further surgical intervention at this time. Will continue to follow with you. Continue current wound care.  Kym Scannell A 07/21/2013, 9:47 AM

## 2013-07-21 NOTE — Progress Notes (Signed)
Note: This document was prepared with digital dictation and possible smart phrase technology. Any transcriptional errors that result from this process are unintentional.   Aaron Gordon [id number] DOB: 02/28/1946 DOA: 07/20/2013 PCP: Rudi HeapMOORE, [name], MD  Brief narrative: 68 y/o ? history of hepatitis C and alcohol-induced cirrhosis, chronic anasarca, peptic ulcer disease, and diastolic dysfunction,-history abscess right chest wall 3/18 admitted 3/21 with confusion hallucinations and rash treated with triamterene  on admission hemoglobin of 11.7, sodium of 134, BUN of 30, and glucose of 100. His lipase is within normal limits at 34. His AST is slightly elevated at 89. His ammonia level is normal at 44. CT of his head reveals no acute intracranial abnormalities, but with atrophy and small vessel ischemic changes. His chest x-ray reveals no active cardiopulmonary disease  Past medical history-As per Problem list Chart reviewed as below-   Consultants:  GI  General surgery  Procedures:  X-ray  Antibiotics:  Vancomycin 3/21  Rocephin 3/22   Subjective  Still somewhat confused to his wife. Note the patient has gained about 20 pounds since last office visit/last discharge summary Optimal weight for him is noted to be 180 pounds Tolerating diet Discomfort and groin secondary to swelling   Objective    Interim History: None  Telemetry: None   Objective: Filed Vitals:   07/20/13 2048 07/20/13 2058 07/21/13 0646 07/21/13 1100  BP:   120/80   Pulse: 87  85   Temp:   97.8 F (36.6 C)   TempSrc:   Oral   Resp: 20  20   Height:      Weight:   91.9 kg (202 lb 9.6 oz)   SpO2: 98% 98% 98% 93%    Intake/Output Summary (Last 24 hours) at 07/21/13 1336 Last data filed at 07/21/13 [id number]  Gross per 24 hour  Intake 343.33 ml  Output    801 ml  Net -457.67 ml    Exam:  General: EOMI, NCAT Cardiovascular: S1-S2 no murmur or gallop Respiratory: Clear Abdomen: Distended  obese, scrotal swelling, Skin lower back and nasal cleft area that is indurated but not losing Neuro no asterixis  Data Reviewed: Basic Metabolic Panel:  Recent Labs Lab 07/17/13 1202 07/20/13 1218 07/21/13 0559  NA 133* 134* 132*  K 3.8 4.0 4.2  CL 97 98 98  CO2 26 25 23   GLUCOSE 107* 100* 98  BUN 23 30* 25*  CREATININE 0.89 1.06 1.00  CALCIUM 8.1* 8.2* 7.7*   Liver Function Tests:  Recent Labs Lab 07/17/13 1202 07/20/13 1218 07/21/13 0559  AST 49* 89* 87*  ALT 18 25 24   ALKPHOS 148* 128* 121*  BILITOT 1.3* 0.9 0.8  PROT 7.4 6.8 5.8*  ALBUMIN 1.4* 1.5* 1.2*    Recent Labs Lab 07/20/13 1218  LIPASE 34    Recent Labs Lab 07/20/13 1218  AMMONIA 44   CBC:  Recent Labs Lab 07/17/13 1202 07/19/13 1432 07/20/13 1218 07/21/13 0559  WBC 10.1 8.4 7.4 7.8  NEUTROABS 6.9  --  4.2  --   HGB 11.9* 11.3* 11.7* 10.4*  HCT 33.6* 34.9* 32.4* 28.5*  MCV 98.0 103.5* 96.7 96.3  PLT 249  --  276 244   Cardiac Enzymes:  Recent Labs Lab 07/20/13 1218  TROPONINI <0.30   BNP: No components found with this basename: POCBNP,  CBG: No results found for this basename: GLUCAP,  in the last 168 hours  No results found for this or any previous visit (from the past 240  hour(s)).   Studies:              All Imaging reviewed and is as per above notation   Scheduled Meds: . Ledipasvir-Sofosbuvir  1 tablet Oral Daily  . LORazepam  1.5 mg Oral QHS  . metolazone  5 mg Oral Daily  . multivitamin with minerals  1 tablet Oral Daily  . pantoprazole  40 mg Oral Daily  . predniSONE  10 mg Oral BID WC  . tiotropium  18 mcg Inhalation Daily  . ursodiol  250 mg Oral BID  . vancomycin  1,000 mg Intravenous Q12H   Continuous Infusions: . 0.9 % NaCl with KCl 20 mEq / L 40 mL/hr at 07/20/13 2125     Assessment/Plan: 1. Toxic metabolic encephalopathy-potentially related to subacute hepatic encephalopathy. Patient's partner notes that he's been taking lactulose only as  needed when he is constipated for the past 2 weeks. Temporally it sounds like he became confused around the time that he had the abscess drained from his chest. General surgery is seen the wound and it does not appear to be grossly infected however I am concerned that he may have seeded the rest of his blood stream when this was incised and he then might have developed an infection. At this point we'll continue vancomycin, add Rocephin for intraperitoneal coverage and obtain diagnostic/therapeutic large-volume paracentesis in the morning-obtain cell count, LDH, fluid protein.   2. Anasarca-20 pounds over his ideal weight. Discussion with gastroenterology today-recommended change to metolazone. Spironolactone and Lasix have been discontinued as they do not work in his specific case. Low-salt diet, fluid ejection not needed as not severely hyponatremic 3. Alcoholic liver disease/hepatitis C/cirrhosis-meld score =11-just continue to abstain from alcohol, continue sofosbuvir as per gastroenterology 4. Hyponatremia-secondary to third spacing from ascites. 5. History of erosive and ulcerative esophagitis-currently stable. Has grade 1 varices. Continue PPI. Will consider low dose beta blocker  As OP  6. History diastolic dysfunction EF 60%-see above 7. Itching-?Aldactone/Lasix cauuing-recommended by GI to add Ursodiol  Code Status: Full Family Communication:  Discussed with Common law wife in room Disposition Plan: INpatient   Pleas Koch, MD  [facility] Pager [phone] 07/21/2013, 1:36 PM    LOS: 1 day

## 2013-07-21 NOTE — Progress Notes (Signed)
Pt stood up to use bathroom and NT noticed a spot on his bottom and called me into room. I looked at it and he has two small sores on his left buttock. I notified Dr. Lovell SheehanJenkins and he will come back to the hospital in the AM and look at the spots. Pt is not complaining of any pain and he is receiving IV antibiotic. Will continue to monitor.

## 2013-07-22 ENCOUNTER — Inpatient Hospital Stay (HOSPITAL_COMMUNITY): Payer: Medicare Other

## 2013-07-22 LAB — CBC WITH DIFFERENTIAL/PLATELET
Basophils Absolute: 0 10*3/uL (ref 0.0–0.1)
Basophils Relative: 0 % (ref 0–1)
EOS PCT: 1 % (ref 0–5)
Eosinophils Absolute: 0.1 10*3/uL (ref 0.0–0.7)
HCT: 28.7 % — ABNORMAL LOW (ref 39.0–52.0)
Hemoglobin: 10.2 g/dL — ABNORMAL LOW (ref 13.0–17.0)
LYMPHS ABS: 1.3 10*3/uL (ref 0.7–4.0)
LYMPHS PCT: 19 % (ref 12–46)
MCH: 34.7 pg — ABNORMAL HIGH (ref 26.0–34.0)
MCHC: 35.5 g/dL (ref 30.0–36.0)
MCV: 97.6 fL (ref 78.0–100.0)
Monocytes Absolute: 0.8 10*3/uL (ref 0.1–1.0)
Monocytes Relative: 11 % (ref 3–12)
NEUTROS PCT: 69 % (ref 43–77)
Neutro Abs: 4.7 10*3/uL (ref 1.7–7.7)
Platelets: 252 10*3/uL (ref 150–400)
RBC: 2.94 MIL/uL — AB (ref 4.22–5.81)
RDW: 15.3 % (ref 11.5–15.5)
WBC: 6.8 10*3/uL (ref 4.0–10.5)

## 2013-07-22 LAB — COMPREHENSIVE METABOLIC PANEL
ALBUMIN: 1.6 g/dL — AB (ref 3.5–5.2)
ALT: 25 U/L (ref 0–53)
AST: 80 U/L — ABNORMAL HIGH (ref 0–37)
Alkaline Phosphatase: 141 U/L — ABNORMAL HIGH (ref 39–117)
BUN: 24 mg/dL — ABNORMAL HIGH (ref 6–23)
CO2: 25 mEq/L (ref 19–32)
Calcium: 8.2 mg/dL — ABNORMAL LOW (ref 8.4–10.5)
Chloride: 98 mEq/L (ref 96–112)
Creatinine, Ser: 1.06 mg/dL (ref 0.50–1.35)
GFR calc non Af Amer: 71 mL/min — ABNORMAL LOW (ref >90)
GFR, EST AFRICAN AMERICAN: 82 mL/min — AB (ref >90)
GLUCOSE: 113 mg/dL — AB (ref 70–99)
Potassium: 4.5 mEq/L (ref 3.7–5.3)
SODIUM: 132 meq/L — AB (ref 137–147)
TOTAL PROTEIN: 6.3 g/dL (ref 6.0–8.3)
Total Bilirubin: 0.7 mg/dL (ref 0.3–1.2)

## 2013-07-22 LAB — HCV RNA QUANT: HCV QUANT: NOT DETECTED [IU]/mL — AB (ref <15)

## 2013-07-22 LAB — PROTIME-INR
INR: 1.45 (ref 0.00–1.49)
Prothrombin Time: 17.3 seconds — ABNORMAL HIGH (ref 11.6–15.2)

## 2013-07-22 MED ORDER — CIPROFLOXACIN HCL 250 MG PO TABS
500.0000 mg | ORAL_TABLET | Freq: Two times a day (BID) | ORAL | Status: DC
  Administered 2013-07-22 – 2013-07-23 (×2): 500 mg via ORAL
  Filled 2013-07-22 (×2): qty 2

## 2013-07-22 MED ORDER — SPIRONOLACTONE 25 MG PO TABS
12.5000 mg | ORAL_TABLET | Freq: Every day | ORAL | Status: DC
  Administered 2013-07-22 – 2013-07-23 (×2): 12.5 mg via ORAL
  Filled 2013-07-22 (×2): qty 1

## 2013-07-22 MED ORDER — PREDNISONE 10 MG PO TABS
5.0000 mg | ORAL_TABLET | Freq: Two times a day (BID) | ORAL | Status: DC
  Administered 2013-07-22 – 2013-07-23 (×2): 5 mg via ORAL
  Filled 2013-07-22 (×2): qty 1

## 2013-07-22 NOTE — Progress Notes (Signed)
Nutrition Brief Note  Patient identified on the Malnutrition Screening Tool (MST) Report  Wt Readings from Last 15 Encounters:  07/22/13 210 lb 1.6 oz (95.301 kg)  07/19/13 200 lb (90.719 kg)  07/17/13 199 lb 3.2 oz (90.357 kg)  07/03/13 190 lb 4.8 oz (86.32 kg)  06/11/13 181 lb 12.8 oz (82.464 kg)  06/04/13 186 lb 3.2 oz (84.46 kg)  05/30/13 191 lb 2.2 oz (86.7 kg)  05/22/13 204 lb (92.534 kg)  05/09/13 180 lb (81.647 kg)  05/09/13 180 lb (81.647 kg)  05/06/13 189 lb 14.4 oz (86.138 kg)  05/03/13 188 lb (85.276 kg)  04/29/13 192 lb (87.091 kg)  04/03/13 181 lb (82.101 kg)  04/01/13 182 lb 3.2 oz (82.645 kg)    Body mass index is 31.95 kg/(m^2). Patient meets criteria for obesity, class I based on current BMI.   Current diet order is Heart Healthy, patient is consuming approximately 50-75% of meals at this time. Labs and medications reviewed.   No nutrition interventions warranted at this time. If nutrition issues arise, please consult RD.   Upton Russey A. Mayford KnifeWilliams, RD, LDN Pager: [phone]

## 2013-07-22 NOTE — Care Management Note (Addendum)
    Page 1 of 2   07/23/2013     2:17:27 PM   CARE MANAGEMENT NOTE 07/23/2013  Patient:  Aaron Gordon,Aaron Gordon   Account Number:  [id number]  Date Initiated:  07/22/2013  Documentation initiated by:  Sharrie RothmanBLACKWELL,Jaslene Marsteller C  Subjective/Objective Assessment:   Pt admitted from home with hepatic encephalopathy. Pt lives with his significant other and will return home at discharge. Pt has been [place] outpt PT services [place]. Pt has a cane and shower bench for home use.     Action/Plan:   Pt would benefit from RW at discharge. Pt chose [facility] for DME and RW will be delivered to pts room prior to discharge. Will continue with outpt PT at discharge.   Anticipated DC Date:  07/25/2013   Anticipated DC Plan:  HOME/SELF CARE      DC Planning Services  CM consult      PAC Choice  DURABLE MEDICAL EQUIPMENT   Choice offered to / List presented to:  C-1 Patient   DME arranged  [facility]      DME agency  [facility]        Status of service:  Completed, signed off Medicare Important Message given?  YES (If response is "NO", the following Medicare IM given date fields will be blank) Date Medicare IM given:  07/23/2013 Date Additional Medicare IM given:    Discharge Disposition:  HOME/SELF CARE  Per UR Regulation:    If discussed at Long Length of Stay Meetings, dates discussed:    Comments:  07/23/13 1415 Arlyss Queenammy Zylah Elsbernd, RN BSN CM Pt discharged home today. No CM needs noted.  07/22/13 1345 Arlyss Queenammy Kamryn Messineo, RN BSN CM

## 2013-07-22 NOTE — Progress Notes (Signed)
Right breast wound continues to heal well. No need for surgical intervention. Will follow peripherally with you.

## 2013-07-22 NOTE — Progress Notes (Signed)
Note: This document was prepared with digital dictation and possible smart phrase technology. Any transcriptional errors that result from this process are unintentional.   Aaron Gordon ZOX:[id number] DOB: 09/08/1945 DOA: 07/20/2013 PCP: Rudi Heap, MD  Brief narrative: 68 y/o ? history of hepatitis C and alcohol-induced cirrhosis, chronic anasarca, peptic ulcer disease, and diastolic dysfunction,-history abscess right chest wall 3/18 admitted 3/21 with confusion hallucinations and rash treated with triamterene  on admission hemoglobin of 11.7, sodium of 134, BUN of 30, and glucose of 100. His lipase is within normal limits at 34. His AST is slightly elevated at 89. His ammonia level is normal at 44. CT of his head reveals no acute intracranial abnormalities, but with atrophy and small vessel ischemic changes. His chest x-ray reveals no active cardiopulmonary disease  Past medical history-As per Problem list Chart reviewed as below-   Consultants:  GI  General surgery  Procedures:  X-ray  Antibiotics:  Vancomycin 3/21  Rocephin 3/22   Subjective   Less confused Wife states that he is doing better but still not completely himself Past 3 loose stools with lactulose Paracentesis could not be performed as there was too little fluid to tap Still feels swollen lower extremities, scrotal area Eating and drinking well Ambulance in the hall today with walker   Objective    Interim History: None  Telemetry: None   Objective: Filed Vitals:   07/21/13 1710 07/21/13 2021 07/22/13 0413 07/22/13 0709  BP: 124/81 136/88 140/71   Pulse: 86 88 80   Temp: 97.8 F (36.6 C) 98.6 F (37 C) 97.4 F (36.3 C)   TempSrc: Oral Oral Oral   Resp: 20 20 20    Height:      Weight:   95.301 kg (210 lb 1.6 oz)   SpO2: 99% 98% 97% 98%    Intake/Output Summary (Last 24 hours) at 07/22/13 1347 Last data filed at 07/22/13 0800  Gross per 24 hour  Intake   1440 ml  Output   1100 ml    Net    340 ml    Exam:  General: EOMI, NCAT Cardiovascular: S1-S2 no murmur or gallop Respiratory: Clear Abdomen: Distended obese, scrotal swelling, Skin lower back and nasal cleft area that is indurated but not losing Neuro no asterixis  Data Reviewed: Basic Metabolic Panel:  Recent Labs Lab 07/17/13 1202 07/20/13 1218 07/21/13 0559 07/22/13 0504  NA 133* 134* 132* 132*  K 3.8 4.0 4.2 4.5  CL 97 98 98 98  CO2 26 25 23 25   GLUCOSE 107* 100* 98 113*  BUN 23 30* 25* 24*  CREATININE 0.89 1.06 1.00 1.06  CALCIUM 8.1* 8.2* 7.7* 8.2*   Liver Function Tests:  Recent Labs Lab 07/17/13 1202 07/20/13 1218 07/21/13 0559 07/22/13 0504  AST 49* 89* 87* 80*  ALT 18 25 24 25   ALKPHOS 148* 128* 121* 141*  BILITOT 1.3* 0.9 0.8 0.7  PROT 7.4 6.8 5.8* 6.3  ALBUMIN 1.4* 1.5* 1.2* 1.6*    Recent Labs Lab 07/20/13 1218  LIPASE 34    Recent Labs Lab 07/20/13 1218  AMMONIA 44   CBC:  Recent Labs Lab 07/17/13 1202 07/19/13 1432 07/20/13 1218 07/21/13 0559 07/22/13 0504  WBC 10.1 8.4 7.4 7.8 6.8  NEUTROABS 6.9  --  4.2  --  4.7  HGB 11.9* 11.3* 11.7* 10.4* 10.2*  HCT 33.6* 34.9* 32.4* 28.5* 28.7*  MCV 98.0 103.5* 96.7 96.3 97.6  PLT 249  --  276 244 252  Cardiac Enzymes:  Recent Labs Lab 07/20/13 1218  TROPONINI <0.30   BNP: No components found with this basename: POCBNP,  CBG: No results found for this basename: GLUCAP,  in the last 168 hours  No results found for this or any previous visit (from the past 240 hour(s)).   Studies:              All Imaging reviewed and is as per above notation   Scheduled Meds: . cefTRIAXone (ROCEPHIN)  IV  1 g Intravenous Q24H  . lactulose  20 g Oral TID  . Ledipasvir-Sofosbuvir  1 tablet Oral Daily  . LORazepam  1.5 mg Oral QHS  . metolazone  5 mg Oral Daily  . multivitamin with minerals  1 tablet Oral Daily  . pantoprazole  40 mg Oral Daily  . predniSONE  10 mg Oral BID WC  . spironolactone  12.5 mg Oral  Daily  . tiotropium  18 mcg Inhalation Daily  . ursodiol  250 mg Oral BID  . vancomycin  1,000 mg Intravenous Q12H   Continuous Infusions:     Assessment/Plan:  1. Toxic metabolic encephalopathy-potentially related to subacute hepatic encephalopathy. Vs noncompliance on lactulose-as he could not get LVP, will transition to oral ciprofloxacin 500 twice a day and observe. No fevers no chills and no abdominal pain therefore unlikely infectious etiology   2. Anasarca-20 pounds over his ideal weight.  His bedside weights are clearly not accurate and we will need to re weigh him standing on a scale daily Discussion with gastroenterology recommended change to metolazone. Spironolactone  12.5 cautiously added as this and Lasix  initially were discontinued as they do not work in his specific case and might have precipitated itching. Low-salt diet, fluid ejection not needed as not severely hyponatremic-patient given one dose of IV albumin 3. Alcoholic liver disease/hepatitis C/cirrhosis-meld score =11-continue to abstain from alcohol, continue sofosbuvir as per gastroenterology 4. Hyponatremia-secondary to third spacing from ascites. 5. History of erosive and ulcerative esophagitis-currently stable. Has grade 1 varices. Continue PPI. Will consider low dose beta blocker  As OP  6. History diastolic dysfunction EF 60%-see above 7. Itching-?Aldactone/Lasix cauuing-recommended by GI to add Ursodiol-steroids and occasional itching which we will taper to 5 mg twice a day  Code Status: Full Family Communication:  Discussed with Common law wife in room Disposition Plan: INpatient-   Pleas KochJai Jerrol Helmers, MD  [facility] Pager [phone] 07/22/2013, 1:47 PM    LOS: 2 days

## 2013-07-22 NOTE — Progress Notes (Signed)
UR chart review completed.  

## 2013-07-23 ENCOUNTER — Encounter: Payer: Medicare Other | Admitting: *Deleted

## 2013-07-23 ENCOUNTER — Ambulatory Visit: Payer: Medicare Other | Admitting: Family Medicine

## 2013-07-23 MED ORDER — SPIRONOLACTONE 12.5 MG HALF TABLET: 12.5000 mg | ORAL_TABLET | Freq: Two times a day (BID) | ORAL | Status: DC

## 2013-07-23 MED ORDER — PREDNISONE 5 MG PO TABS: 5.0000 mg | ORAL_TABLET | Freq: Two times a day (BID) | ORAL | Status: AC

## 2013-07-23 MED ORDER — LACTULOSE 10 GM/15ML PO SOLN: 20.0000 g | Freq: Three times a day (TID) | ORAL | Status: DC

## 2013-07-23 MED ORDER — CIPROFLOXACIN HCL 500 MG PO TABS: 500.0000 mg | ORAL_TABLET | Freq: Two times a day (BID) | ORAL | Status: DC

## 2013-07-23 MED ORDER — METOLAZONE 5 MG PO TABS: 5.0000 mg | ORAL_TABLET | Freq: Every day | ORAL | Status: DC

## 2013-07-23 NOTE — Progress Notes (Addendum)
IV removed, site WNL.  Pt given d/c instructions and new prescriptions.  Discussed all home medications, changed medications, and discontinued medications (when, how, and why to take), patient verbalizes understanding. Discussed wound care with patient.  Given supplies to assist with wound care. Discussed home care with patient and his significant other, teachback completed. Pt has had walker delivered and has at the time of discharge.  F/U appointment with Dr Karilyn Cotaehman in place, pt states they will keep appointment. Pt is stable at this time. Pt taken to main entrance in wheelchair by staff member.

## 2013-07-23 NOTE — Progress Notes (Signed)
UR chart review completed.  

## 2013-07-23 NOTE — Discharge Summary (Signed)
Physician Discharge Summary  Aaron MiniumJose L Gordon WGN:[id number]:[id number] DOB: October 20, 1945 DOA: 07/20/2013  PCP: Rudi HeapMOORE, [name], MD  Admit date: 07/20/2013 Discharge date: 07/23/2013  Time spent: 35 minutes  Recommendations for Outpatient Follow-up:  1. Recommend repeat labs, chem 12, INR, CBC in about one week 2. . Gastroenterology to determine need for antiviral therapy has been held for now 3. Close monitoring of weight and would counsel compliance on diet 4. Discontinue antibiotics/steroids on 3/29   Discharge Diagnoses:  Principal Problem:   Acute encephalopathy Active Problems:   Liver cirrhosis   Anasarca   Medication side effect   Pruritic rash   Abscess of chest wall   Insomnia   Anemia due to chronic blood loss   Peptic ulcer disease   Discharge Condition: stab;e  Diet recommendation: Low-salt heart healthy  Filed Weights   07/21/13 0646 07/22/13 0413 07/23/13 0421  Weight: 91.9 kg (202 lb 9.6 oz) 95.301 kg (210 lb 1.6 oz) 92.7 kg (204 lb 5.9 oz)    History of present illness:  68 y/o ? history of hepatitis C and alcohol-induced cirrhosis, chronic anasarca, peptic ulcer disease, and diastolic dysfunction,-history abscess right chest wall 3/18 admitted 3/21 with confusion hallucinations and rash treated with triamcinolone cream but was also taking Benadryl as well on admission hemoglobin of 11.7, sodium of 134, BUN of 30, and glucose of 100. His lipase is within normal limits at 34. His AST is slightly elevated at 89. His ammonia level is normal at 44. CT of his head reveals no acute intracranial abnormalities, but with atrophy and small vessel ischemic changes. His chest x-ray reveals no active cardiopulmonary disease. He was confused to some extent on admission and it was thought to be toxic metabolic encephalopathy potentially secondary to infection of the chest wall as he had had a recent I&D 3/18. Because of his confusion in the setting of ascites, it was thought that SBP could be  playing a role with this as well. Unfortunately was not able to be tapped and was empirically treated with IV antibiotics and transitioned to by mouth See below   Hospital Course:   1. Toxic metabolic encephalopathy-potentially related to subacute hepatic encephalopathy. Vs noncompliance on lactulose-as he could not get LVP, will transition to oral ciprofloxacin 500 twice a day until 3/29 and observe. No fevers no chills and no abdominal pain therefore unlikely infectious etiology-it is possible that he also was confused because of use of Benadryl 2. Anasarca-20 pounds over his ideal weight. His bedside weights are clearly not accurate however has swelling in his groin and abdomen decreased slightly on discharge. Discussion with gastroenterology recommended change to metolazone. Spironolactone 12.5 cautiously added as to help potentiate the action of thiazide and increased to 12.5 twice a day on discharge. Lasix and thought to have precipitated in the past his itching. Low-salt diet, fluid ejection not needed as not severely hyponatremic-patient given one dose of IV albumin this admission 3. Alcoholic liver disease/hepatitis C/cirrhosis-meld score =11-continue to abstain from alcohol, continue sofosbuvir as per gastroenterology-I have instructed him to hold on taking this until he sees Dr. a month 4. Hyponatremia-secondary to third spacing from ascites. 5. History of erosive and ulcerative esophagitis-currently stable. Has grade 1 varices. Continue PPI. Will consider low dose beta blocker As OP  6. History diastolic dysfunction EF 60%-see above 7. Itching-?Aldactone/Lasix causing this-recommended by GI to add Ursodiol-prednisone tapered to 5 mg twice a day until 3/29    Consultants:  GI  General surgery Procedures:  X-ray  Antibiotics:  Vancomycin 3/21 ??? 3/22 Rocephin 3/22 ???3/22 Ciprofloxacin 3/24 ??? 3/29  Discharge Exam: Filed Vitals:   07/23/13 0421  BP: 129/79  Pulse: 107  Temp:  98 F (36.7 C)  Resp: 20  more alert pleasant oriented no apparent distress  Swelling decreased Itching present decreased and amenable to the use of cream    doing well no other issues S1-S2 no murmur rub or gallop Abdomen enlarged non-tender no scrotal edema currently  Discharge Instructions  Discharge Orders   Future Appointments Provider Department Dept Phone   07/30/2013 8:45 AM Malissa Hippo, MD [facility] [phone]   08/05/2013 10:30 AM Malissa Hippo, MD  [facility] [phone]   Future Orders Complete By Expires   Diet - low sodium heart healthy  As directed    Discharge instructions  As directed    Comments:     Do not take Harvoni until u see Dr. Karilyn Cota I have started you on 2 new fluid pills. Taken as directed and monitor your weight Please keep taking your lactulose 3 times a day to make sure that you have soft stools 2-3 times a day Followup with the gastroenterologist soon Continue to use the cream for itching Stop taking prednisone in 3 or 4 days as directed by discharge instructions   Increase activity slowly  As directed        Medication List    STOP taking these medications       doxycycline 100 MG capsule  Commonly known as:  VIBRAMYCIN     HYDROcodone-acetaminophen 5-325 MG per tablet  Commonly known as:  NORCO/VICODIN     Ledipasvir-Sofosbuvir 90-400 MG Tabs  Commonly known as:  HARVONI      TAKE these medications       albuterol 108 (90 BASE) MCG/ACT inhaler  Commonly known as:  PROVENTIL HFA;VENTOLIN HFA  Inhale 2 puffs into the lungs every 6 (six) hours as needed for wheezing or shortness of breath.     CENTRUM SILVER ADULT 50+ PO  Take 1 tablet by mouth daily.     ciprofloxacin 500 MG tablet  Commonly known as:  CIPRO  Take 1 tablet (500 mg total) by mouth 2 (two) times daily.     furosemide 20 MG tablet  Commonly known as:  LASIX  Take 40 mg by mouth 2 (two) times daily.      lactulose 10 GM/15ML solution  Commonly known as:  CHRONULAC  Take 30 mLs (20 g total) by mouth 3 (three) times daily.     LORazepam 1 MG tablet  Commonly known as:  ATIVAN  Take 1 tablet (1 mg total) by mouth at bedtime.     metolazone 5 MG tablet  Commonly known as:  ZAROXOLYN  Take 1 tablet (5 mg total) by mouth daily.     omeprazole 20 MG capsule  Commonly known as:  PRILOSEC  Take 1 capsule (20 mg total) by mouth daily.     predniSONE 5 MG tablet  Commonly known as:  DELTASONE  Take 1 tablet (5 mg total) by mouth 2 (two) times daily with a meal.     spironolactone 100 MG tablet  Commonly known as:  ALDACTONE  Take 1 tablet (100 mg total) by mouth 2 (two) times daily.     spironolactone 12.5 mg Tabs tablet  Commonly known as:  ALDACTONE  Take 0.5 tablets (12.5 mg total) by mouth 2 (two) times daily.  tiotropium 18 MCG inhalation capsule  Commonly known as:  SPIRIVA  Place 1 capsule (18 mcg total) into inhaler and inhale daily.     triamcinolone 0.025 % cream  Commonly known as:  KENALOG  Apply 1 application topically 2 (two) times daily. Glide 2 area of his skin rash twice daily as directed     ursodiol 250 MG tablet  Commonly known as:  ACTIGALL  Take 1 tablet (250 mg total) by mouth 2 (two) times daily.       No Known Allergies    The results of significant diagnostics from this hospitalization (including imaging, microbiology, ancillary and laboratory) are listed below for reference.    Significant Diagnostic Studies: Ct Head Wo Contrast  07/20/2013   CLINICAL DATA:  Altered mental status, history cirrhosis, smoking  EXAM: CT HEAD WITHOUT CONTRAST  TECHNIQUE: Contiguous axial images were obtained from the base of the skull through the vertex without intravenous contrast.  COMPARISON:  None  FINDINGS: Generalized atrophy.  Normal ventricular morphology.  No midline shift or mass effect.  Small vessel chronic ischemic changes in deep cerebral white matter.   Otherwise normal appearance of brain parenchyma.  No intracranial hemorrhage, mass lesion or evidence acute infarction.  No extra-axial fluid collections.  Bones demineralized but otherwise unremarkable.  Few atherosclerotic calcifications at skullbase.  Paranasal sinuses clear.  IMPRESSION: Atrophy with minimal small vessel chronic ischemic changes of deep cerebral white matter.  No acute intracranial abnormalities.   Electronically Signed   By: Ulyses Southward M.D.   On: 07/20/2013 15:15   US Abdomen Limited  07/22/2013   CLINICAL DATA:  Evaluate for possible paracentesis.  EXAM: LIMITED ABDOMEN ULTRASOUND FOR ASCITES  TECHNIQUE: Limited ultrasound survey for ascites was performed in all four abdominal quadrants.  COMPARISON:  06/10/2013  FINDINGS: All 4 quadrants of the abdomen was scanned. No appreciable ascites was demonstrated to perform a safe paracentesis.  IMPRESSION: Minimal fluid in the right lower quadrant but no appreciable ascites to perform a safe paracentesis.   Electronically Signed   By: Loralie Champagne M.D.   On: 07/22/2013 10:24   Dg Chest Portable 1 View  07/20/2013   CLINICAL DATA:  Altered mental status  EXAM: PORTABLE CHEST - 1 VIEW  COMPARISON:  DG ABD ACUTE W/CHEST dated 03/23/2013  FINDINGS: Right hemidiaphragm remains elevated with basilar atelectasis. Left lung is clear. Normal heart size. No pneumothorax.  IMPRESSION: No active cardiopulmonary disease. Stable chronic changes at the right lung base.   Electronically Signed   By: Maryclare Bean M.D.   On: 07/20/2013 12:54    Microbiology: No results found for this or any previous visit (from the past 240 hour(s)).   Labs: Basic Metabolic Panel:  Recent Labs Lab 07/17/13 1202 07/20/13 1218 07/21/13 0559 07/22/13 0504  NA 133* 134* 132* 132*  K 3.8 4.0 4.2 4.5  CL 97 98 98 98  CO2 26 25 23 25   GLUCOSE 107* 100* 98 113*  BUN 23 30* 25* 24*  CREATININE 0.89 1.06 1.00 1.06  CALCIUM 8.1* 8.2* 7.7* 8.2*   Liver Function  Tests:  Recent Labs Lab 07/17/13 1202 07/20/13 1218 07/21/13 0559 07/22/13 0504  AST 49* 89* 87* 80*  ALT 18 25 24 25   ALKPHOS 148* 128* 121* 141*  BILITOT 1.3* 0.9 0.8 0.7  PROT 7.4 6.8 5.8* 6.3  ALBUMIN 1.4* 1.5* 1.2* 1.6*    Recent Labs Lab 07/20/13 1218  LIPASE 34    Recent Labs Lab 07/20/13  1218  AMMONIA 44   CBC:  Recent Labs Lab 07/17/13 1202 07/19/13 1432 07/20/13 1218 07/21/13 0559 07/22/13 0504  WBC 10.1 8.4 7.4 7.8 6.8  NEUTROABS 6.9  --  4.2  --  4.7  HGB 11.9* 11.3* 11.7* 10.4* 10.2*  HCT 33.6* 34.9* 32.4* 28.5* 28.7*  MCV 98.0 103.5* 96.7 96.3 97.6  PLT 249  --  276 244 252   Cardiac Enzymes:  Recent Labs Lab 07/20/13 1218  TROPONINI <0.30   BNP: BNP (last 3 results) No results found for this basename: PROBNP,  in the last 8760 hours CBG: No results found for this basename: GLUCAP,  in the last 168 hours     Signed:  Rhetta Mura  [facility] 07/23/2013, 1:27 PM

## 2013-07-23 NOTE — Progress Notes (Signed)
Patient asleep, not ambulated in hall.

## 2013-07-25 ENCOUNTER — Telehealth: Payer: Self-pay | Admitting: *Deleted

## 2013-07-25 ENCOUNTER — Telehealth: Payer: Self-pay | Admitting: Family Medicine

## 2013-07-25 MED ORDER — SPIRONOLACTONE 25 MG PO TABS: 12.5000 mg | ORAL_TABLET | Freq: Two times a day (BID) | ORAL | Status: DC

## 2013-07-25 NOTE — Telephone Encounter (Signed)
There is a lot of confusion about how much aldactone he is supposed to be taking. He has been on 100mg  regularly and was discharged with ambiguous directions for aldactone 12.5mg  1/2 (total of 12.5mg ) twice a day #60. We attempted to interpret these directions but found it to be too confusing. I spoke with the pharmacist and she said she spoke with someone from the discharging providers office this morning and that they stated they would contact his PCP. We haven't spoken with anyone. Appt scheduled for tomorrow to review medications. Asked them to bring all medications to the appt.

## 2013-07-25 NOTE — Telephone Encounter (Signed)
May advance packing by 1/4 inch daily and cut off the excess. Try to not remove packing entirely. Keep area clean and dry. S/S of infection discussed. He is taking antibiotics but she will call back if he develops any signs.  She also mentioned that aldactone was on his med list but they didn't pick up a prescription at the pharmacy once discharged. Researched and he was written a new script for aldactone while in the hospital and it was sent electronically to [facility] but a confirmation wasn't receive back. The directions were questionable. Discussed this will Ander SladeBill Oxford, FNP who provided new directions. Script sent over electronically and confirmation received.

## 2013-07-26 ENCOUNTER — Encounter: Payer: Self-pay | Admitting: Family Medicine

## 2013-07-26 ENCOUNTER — Ambulatory Visit (INDEPENDENT_AMBULATORY_CARE_PROVIDER_SITE_OTHER): Payer: Medicare Other | Admitting: Family Medicine

## 2013-07-26 VITALS — BP 130/86 | HR 81 | Temp 96.2°F | Ht 68.0 in | Wt 204.2 lb

## 2013-07-26 DIAGNOSIS — K7290 Hepatic failure, unspecified without coma: Secondary | ICD-10-CM

## 2013-07-26 DIAGNOSIS — A0472 Enterocolitis due to Clostridium difficile, not specified as recurrent: Secondary | ICD-10-CM

## 2013-07-26 DIAGNOSIS — K7682 Hepatic encephalopathy: Secondary | ICD-10-CM

## 2013-07-29 ENCOUNTER — Ambulatory Visit (INDEPENDENT_AMBULATORY_CARE_PROVIDER_SITE_OTHER): Payer: Medicare Other | Admitting: *Deleted

## 2013-07-29 DIAGNOSIS — T148XXA Other injury of unspecified body region, initial encounter: Secondary | ICD-10-CM

## 2013-07-29 NOTE — Patient Instructions (Signed)
Wound Care Wound care helps prevent pain and infection.  You may need a tetanus shot if:  You cannot remember when you had your last tetanus shot.  You have never had a tetanus shot.  The injury broke your skin. If you need a tetanus shot and you choose not to have one, you may get tetanus. Sickness from tetanus can be serious. HOME CARE   Only take medicine as told by your doctor.  Clean the wound daily with mild soap and water.  Change any bandages (dressings) as told by your doctor.  Put medicated cream and a bandage on the wound as told by your doctor.  Change the bandage if it gets wet, dirty, or starts to smell.  Take showers. Do not take baths, swim, or do anything that puts your wound under water.  Rest and raise (elevate) the wound until the pain and puffiness (swelling) are better.  Keep all doctor visits as told. GET HELP RIGHT AWAY IF:   Yellowish-white fluid (pus) comes from the wound.  Medicine does not lessen your pain.  There is a red streak going away from the wound.  You have a fever. MAKE SURE YOU:   Understand these instructions.  Will watch your condition.  Will get help right away if you are not doing well or get worse. Document Released: 01/26/2008 Document Revised: 07/11/2011 Document Reviewed: 08/22/2010 Exit[employer] Patient Information 2014 [employer].  

## 2013-07-29 NOTE — Progress Notes (Signed)
   Subjective:    Patient ID: Aaron Gordon, male    DOB: 07-Jul-1945, 68 y.o.   MRN: [id number]  HPI  This [age] y.o. male presents for evaluation of hospital follow up.  He was seen for hepatic Encephalopathy and c difficile colitis.  He is doing better and is taking lactulose for hepatic Insufficiency.  He is taking vancomycin 125mg  po qid for c-difficile colitis..  Review of Systems No chest pain, SOB, HA, dizziness, vision change, N/V, diarrhea, constipation, dysuria, urinary urgency or frequency, myalgias, arthralgias or rash.     Objective:   Physical Exam  Vital signs noted  Chronically ill appearing male.  HEENT - Head atraumatic Normocephalic                Eyes - PERRLA, Conjuctiva - clear Sclera- Clear EOMI                Ears - EAC's Wnl TM's Wnl Gross Hearing WNL                Nose - Nares patent                 Throat - oropharanx wnl Respiratory - Lungs CTA bilateral Cardiac - RRR S1 and S2 without murmur GI - Abdomen soft Nontender and bowel sounds active x 4 Neuro - CN 2-12 intact and alert and Ox3      Assessment & Plan:  Enteritis due to Clostridium difficile - Continue Vancomycin  Encephalopathy, hepatic - Continue to take lactulose and do not miss doses and follow up with GI.  Deatra CanterWilliam J Oxford FNP

## 2013-07-29 NOTE — Progress Notes (Signed)
Patient returns today for a dressing change at the request of Ander SladeBill Oxford, FNP. Verbal order was given.  Patient reports no pain or drainage. He has left the dressing on right chest intact but has not been able to keep a dressing on his buttocks wound.   Dressing removed from right chest. Patient has two small, healing wounds. Packing is no longer present. Linear wounds measuring approximately 1cm x 2mm and 1.5cm x 2mm. Depth is approx 2mm for both. No drainage, tenderness or warmth. Mild erythema surrounding both wounds. Buttocks wound is nontender. There is no drainage or warmth. Mild erythema surrounding both wounds. Round wounds measuring appox 5mm x 5mm and 2mm x 3mm.   Areas cleaned with normal saline and covered with a large bandaid. No need for thick dressing at this point.   Advised patient and caregiver to keep area clean and dry. Change dressing daily and PRN if soiled. They stated understanding and agreement to plan. Return to clinic as planned and PRN.

## 2013-07-30 ENCOUNTER — Encounter (INDEPENDENT_AMBULATORY_CARE_PROVIDER_SITE_OTHER): Payer: Self-pay | Admitting: Internal Medicine

## 2013-07-30 ENCOUNTER — Ambulatory Visit (INDEPENDENT_AMBULATORY_CARE_PROVIDER_SITE_OTHER): Payer: Medicare Other | Admitting: Internal Medicine

## 2013-07-30 VITALS — BP 118/80 | HR 88 | Temp 97.1°F | Resp 18 | Ht 68.0 in | Wt 206.3 lb

## 2013-07-30 DIAGNOSIS — E8770 Fluid overload, unspecified: Secondary | ICD-10-CM

## 2013-07-30 DIAGNOSIS — B182 Chronic viral hepatitis C: Secondary | ICD-10-CM

## 2013-07-30 DIAGNOSIS — K7290 Hepatic failure, unspecified without coma: Secondary | ICD-10-CM

## 2013-07-30 DIAGNOSIS — E8779 Other fluid overload: Secondary | ICD-10-CM

## 2013-07-30 DIAGNOSIS — K7460 Unspecified cirrhosis of liver: Secondary | ICD-10-CM

## 2013-07-30 DIAGNOSIS — K7682 Hepatic encephalopathy: Secondary | ICD-10-CM

## 2013-07-30 LAB — BASIC METABOLIC PANEL
BUN: 32 mg/dL — AB (ref 6–23)
CALCIUM: 8.2 mg/dL — AB (ref 8.4–10.5)
CO2: 30 mEq/L (ref 19–32)
CREATININE: 0.81 mg/dL (ref 0.50–1.35)
Chloride: 95 mEq/L — ABNORMAL LOW (ref 96–112)
Glucose, Bld: 82 mg/dL (ref 70–99)
Potassium: 4 mEq/L (ref 3.5–5.3)
Sodium: 130 mEq/L — ABNORMAL LOW (ref 135–145)

## 2013-07-30 LAB — PROTIME-INR
INR: 1.28 (ref <1.50)
Prothrombin Time: 15.8 seconds — ABNORMAL HIGH (ref 11.6–15.2)

## 2013-07-30 MED ORDER — SPIRONOLACTONE 50 MG PO TABS: 50.0000 mg | ORAL_TABLET | Freq: Two times a day (BID) | ORAL | Status: AC

## 2013-07-30 MED ORDER — LACTULOSE 10 GM/15ML PO SOLN: 20.0000 g | Freq: Two times a day (BID) | ORAL | Status: DC

## 2013-07-30 MED ORDER — METOLAZONE 5 MG PO TABS: 5.0000 mg | ORAL_TABLET | ORAL | Status: AC | PRN

## 2013-07-30 NOTE — Patient Instructions (Signed)
Must stay on low-salt diet. Can take metolazone 5 mg every third day as needed for fluid overload. Daily weights. Use mittens at night in order to prevent skin injury from scratching. [facility]

## 2013-07-30 NOTE — Progress Notes (Signed)
Presenting complaint;  Fdollowup for ecompensated liver disease.  Subjective:  Patient is 68 year old male with decompensated cirrhosis secondary to chronic hepatitis C and prior alcohol abuse who presents for followup visit accompanied by his wife. He was hospitalized the for last for encephalopathy and begun on lactulose. He had been on lactulose previously on as-needed basis for constipation. He was also diagnosed with cellulitis. He was thought to have significant ascites. Ultrasound revealed minimal ascites it was not tapped. He was also having intractable itching he was treated with steroids and Lasix and Aldactone were discontinued. He was seen by his PCP and advised to go back on Lasix and Aldactone at low dose. He reports increasing lower extremity edema as well as edema to his external genitalia. He has gained 16 pounds since his last office visit of 07/03/2013. He reports right is only at night. He remains with a very good appetite. His wife believes he is consuming some foods that he should not being. His bowels are moving 3-4 times a day. He needs new prescription for lactulose. Patient is not interested in referral for transplant evaluation. Patient is not taking Harvoni which was stopped during recent hospitalization.   Current Medications: Outpatient Encounter Prescriptions as of 07/30/2013  Medication Sig  . albuterol (PROVENTIL HFA;VENTOLIN HFA) 108 (90 BASE) MCG/ACT inhaler Inhale 2 puffs into the lungs every 6 (six) hours as needed for wheezing or shortness of breath.  . furosemide (LASIX) 20 MG tablet Take 40 mg by mouth 2 (two) times daily.  Marland Kitchen. lactulose (CHRONULAC) 10 GM/15ML solution Take 30 mLs (20 g total) by mouth 3 (three) times daily.  Marland Kitchen. LORazepam (ATIVAN) 1 MG tablet Take 1 tablet (1 mg total) by mouth at bedtime.  . Multiple Vitamins-Minerals (CENTRUM SILVER ADULT 50+ PO) Take 1 tablet by mouth daily.  Marland Kitchen. omeprazole (PRILOSEC) 20 MG capsule Take 1 capsule (20 mg  total) by mouth daily.  Marland Kitchen. spironolactone (ALDACTONE) 25 MG tablet Take 0.5 tablets (12.5 mg total) by mouth 2 (two) times daily.  Marland Kitchen. tiotropium (SPIRIVA) 18 MCG inhalation capsule Place 1 capsule (18 mcg total) into inhaler and inhale daily.  Marland Kitchen. triamcinolone (KENALOG) 0.025 % cream Apply 1 application topically 2 (two) times daily. Glide 2 area of his skin rash twice daily as directed  . ursodiol (ACTIGALL) 250 MG tablet Take 1 tablet (250 mg total) by mouth 2 (two) times daily.  . metolazone (ZAROXOLYN) 5 MG tablet Take 1 tablet (5 mg total) by mouth daily.  . predniSONE (DELTASONE) 5 MG tablet Take 1 tablet (5 mg total) by mouth 2 (two) times daily with a meal.  . [DISCONTINUED] ciprofloxacin (CIPRO) 500 MG tablet Take 1 tablet (500 mg total) by mouth 2 (two) times daily.     Objective: Blood pressure 118/80, pulse 88, temperature 97.1 F (36.2 C), temperature source Oral, resp. rate 18, height 5\' 8"  (1.727 m), weight 206 lb 4.8 oz (93.577 kg). Patient is alert and does not have asterixis. Conjunctiva is pink. Sclera is nonicteric Oropharyngeal mucosa is normal. No neck masses or thyromegaly noted. Cardiac exam with regular rhythm normal S1 and S2. No murmur or gallop noted. Lungs are clear to auscultation. Abdomen is full but soft and nontender no organomegaly or masse.  He has penile and scrotal edema. He has 3+ pitting edema and warnings legs and some edema above level of his knees.  Labs/studies Results: HCVRNA quantitative undetectable on 07/21/2013.   Assessment:  #1. Fluid overload. He has anasarca and less ascites.  He does not appear to be watching salt intake. Fluid overload appears to be major problem at this time. Unfortunately his serum albumin remains quite low resulting in difficult management. #2. Decompensated cirrhosis. He has not had any alcohol in over 4 months. HCVRNA last week was negative after he had been on antiviral therapy for 4 weeks. Unfortunately antiviral  was discontinued without my knowledge. So far there appears to be no improvement in hepatic function. #3. Hepatic encephalopathy. Clinically he is not encephalopathc.    Plan: Resume Harvoni 90/400 one tablet by mouth daily. Patient will go to lab for metabolic 7 and INR. Increasespironolactone to 50 mg by mouth twice a day. Metolazone 5 mg by mouth every 72 hours as needed. Patient advised that he must stick to low salt diet. Patient advised to use mittens at night in order to prevent scratching and cellulitis. Referral to [facility] for liver transplant evaluation. Office visit in 2 weeks.

## 2013-08-01 ENCOUNTER — Other Ambulatory Visit (INDEPENDENT_AMBULATORY_CARE_PROVIDER_SITE_OTHER): Payer: Self-pay | Admitting: Internal Medicine

## 2013-08-01 DIAGNOSIS — K7682 Hepatic encephalopathy: Secondary | ICD-10-CM

## 2013-08-01 DIAGNOSIS — K7290 Hepatic failure, unspecified without coma: Secondary | ICD-10-CM

## 2013-08-01 MED ORDER — LACTULOSE 10 GM/15ML PO SOLN: 20.0000 g | Freq: Two times a day (BID) | ORAL | Status: AC

## 2013-08-05 ENCOUNTER — Ambulatory Visit (INDEPENDENT_AMBULATORY_CARE_PROVIDER_SITE_OTHER): Payer: Medicare Other | Admitting: Internal Medicine

## 2013-08-06 ENCOUNTER — Telehealth (INDEPENDENT_AMBULATORY_CARE_PROVIDER_SITE_OTHER): Payer: Self-pay | Admitting: *Deleted

## 2013-08-06 NOTE — Telephone Encounter (Signed)
Dr.Rehman has called the family.

## 2013-08-06 NOTE — Telephone Encounter (Signed)
Darl PikesSusan called and said Marv's family is in from [place]. They are wanting to speak with Dr. Karilyn Cotaehman if at all possible. Susan's return phone number is [phone].

## 2013-08-07 ENCOUNTER — Other Ambulatory Visit (INDEPENDENT_AMBULATORY_CARE_PROVIDER_SITE_OTHER): Payer: Self-pay | Admitting: Internal Medicine

## 2013-08-08 ENCOUNTER — Telehealth (INDEPENDENT_AMBULATORY_CARE_PROVIDER_SITE_OTHER): Payer: Self-pay | Admitting: *Deleted

## 2013-08-08 NOTE — Telephone Encounter (Signed)
We rec'd a refill request for the Ativan 1 mg. Per Dr. Karilyn Cotaehman may refill #30 no refills. [facility] [place]

## 2013-08-09 ENCOUNTER — Telehealth (INDEPENDENT_AMBULATORY_CARE_PROVIDER_SITE_OTHER): Payer: Self-pay | Admitting: *Deleted

## 2013-08-09 NOTE — Telephone Encounter (Signed)
Noted and forwarded to Dr.Rehman as an FYI.

## 2013-08-09 NOTE — Telephone Encounter (Signed)
Aaron PikesSusan said Aaron Gordon left on 08/09/13 going back with family to [place].

## 2013-08-09 NOTE — Telephone Encounter (Signed)
Will send records to his physician in [place]

## 2013-08-13 ENCOUNTER — Ambulatory Visit (INDEPENDENT_AMBULATORY_CARE_PROVIDER_SITE_OTHER): Payer: Medicare Other | Admitting: Internal Medicine

## 2013-08-22 ENCOUNTER — Telehealth (INDEPENDENT_AMBULATORY_CARE_PROVIDER_SITE_OTHER): Payer: Self-pay | Admitting: *Deleted

## 2013-08-22 NOTE — Telephone Encounter (Signed)
Hailey's sister called and would like for the nurse to return the call. She has questions she would like to ask. The return phone number is [phone].

## 2013-08-22 NOTE — Telephone Encounter (Signed)
Phone call returned. The patient stated that he wanted to talk to us. He spoke to both Dr.Rehman and me.

## 2013-09-11 ENCOUNTER — Telehealth (INDEPENDENT_AMBULATORY_CARE_PROVIDER_SITE_OTHER): Payer: Self-pay | Admitting: *Deleted

## 2013-09-11 NOTE — Telephone Encounter (Signed)
Sorry to hear that Aaron Gordon has died.

## 2013-09-11 NOTE — Telephone Encounter (Signed)
Our prayers are with Aaron Gordon and all the family.

## 2013-09-11 NOTE — Telephone Encounter (Signed)
Message left Tuesday 4:49 pm  Darl PikesSusan called to make Aaron Gordon aware that [facility] pm.  She wanted Aaron Gordon to know how much they were appreciative to all the kindness and care that was given to him by our staff while he was our patient.  Thank you

## 2013-09-30 DEATH — deceased

## 2014-12-10 IMAGING — CT CT ABD-PELV W/ CM
2 of 5 series · 16 of 46 positions shown, 18 images · IV contrast (Omnipaque 300)
Comparison: None.

CLINICAL DATA: Left lower quadrant abdominal pain. Black stools.
Previous splenectomy.

EXAM:
CT ABDOMEN AND PELVIS WITH CONTRAST
TECHNIQUE: Multidetector CT imaging of the abdomen and pelvis was performed
using the standard protocol following bolus administration of
intravenous contrast.
CONTRAST:  50mL OMNIPAQUE IOHEXOL 300 MG/ML SOLN, 100mL OMNIPAQUE
IOHEXOL 300 MG/ML SOLN

[Series 2: abd_pel_with 5.0 b40f · axial · 0.85mm/px · z∈[-406,-1]mm · 13 of 93 slices shown, 15 images]
[im 6/93  soft-tissue]
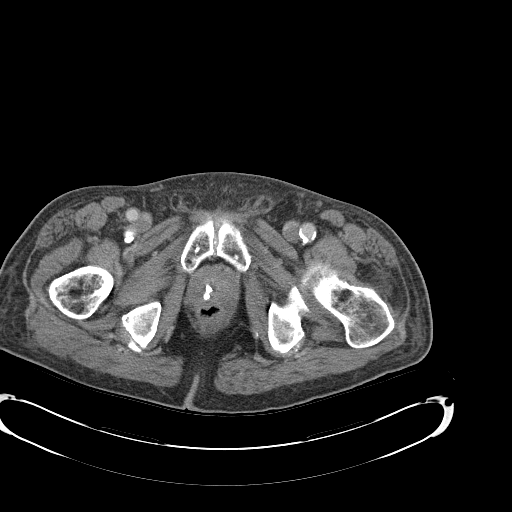
[im 6/93  bone]
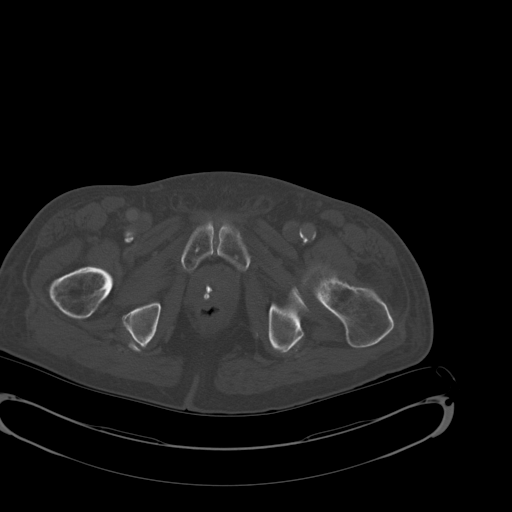
[im 11/93  soft-tissue]
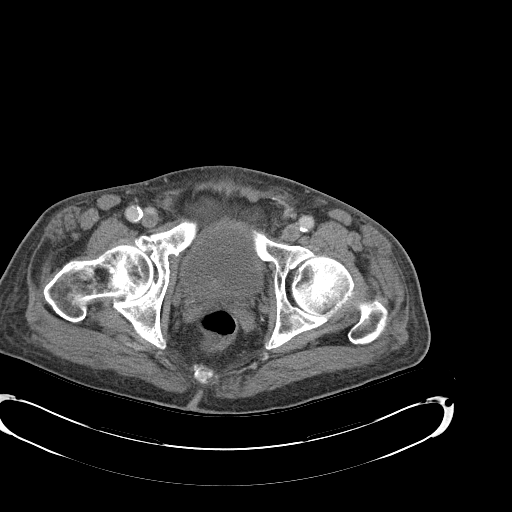
[im 21/93  soft-tissue]
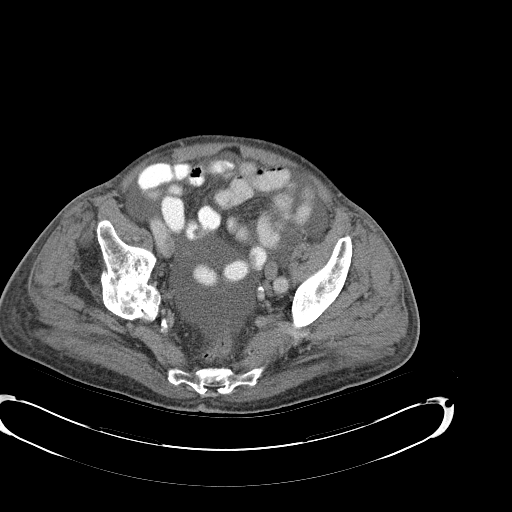
[im 26/93  soft-tissue]
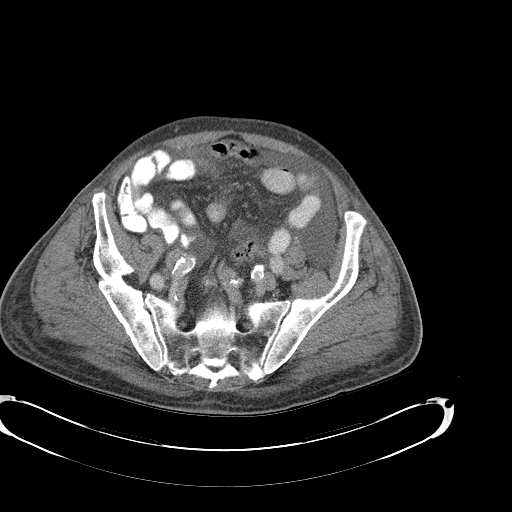
[im 31/93  soft-tissue]
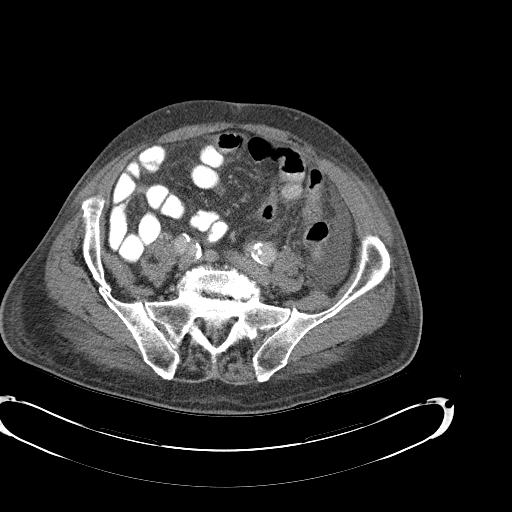
[im 41/93  soft-tissue]
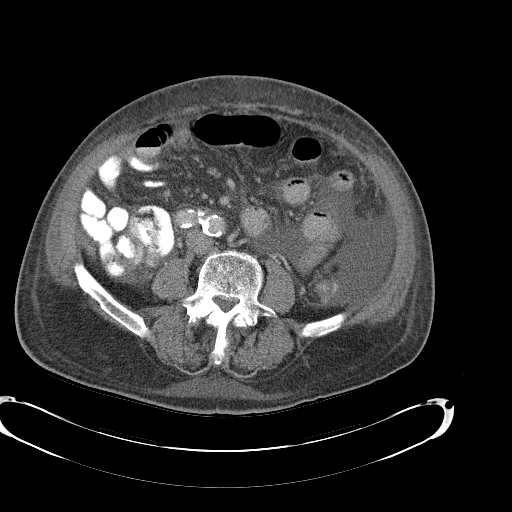
[im 47/93  soft-tissue]
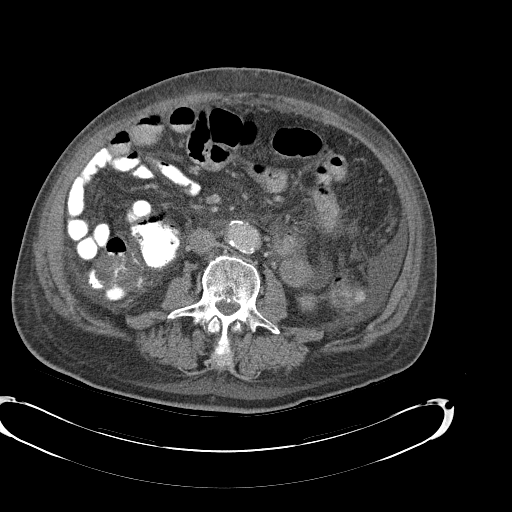
[im 52/93  soft-tissue]
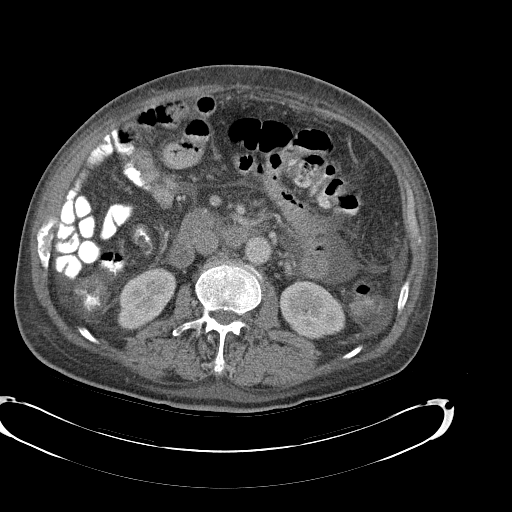
[im 62/93  soft-tissue]
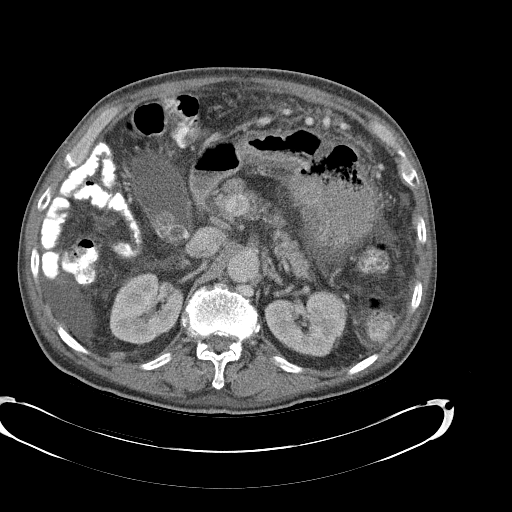
[im 62/93  bone]
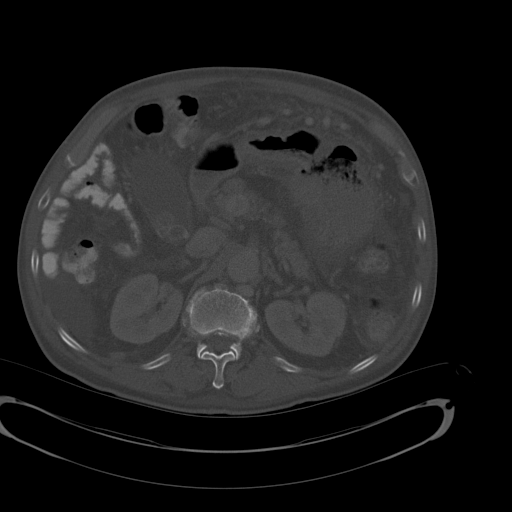
[im 67/93  soft-tissue]
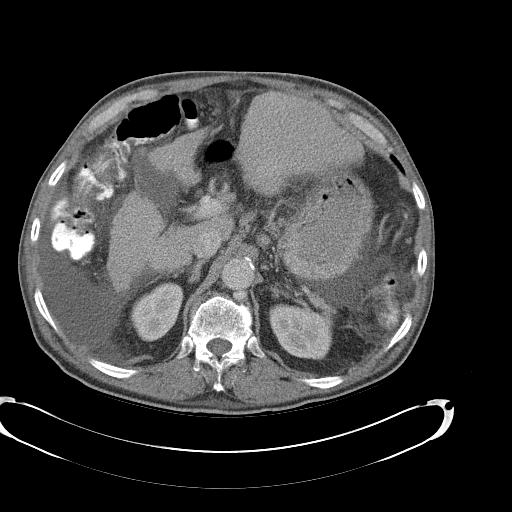
[im 72/93  soft-tissue]
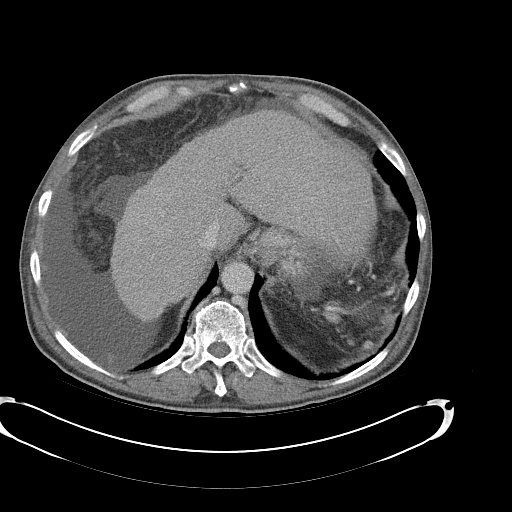
[im 82/93  soft-tissue]
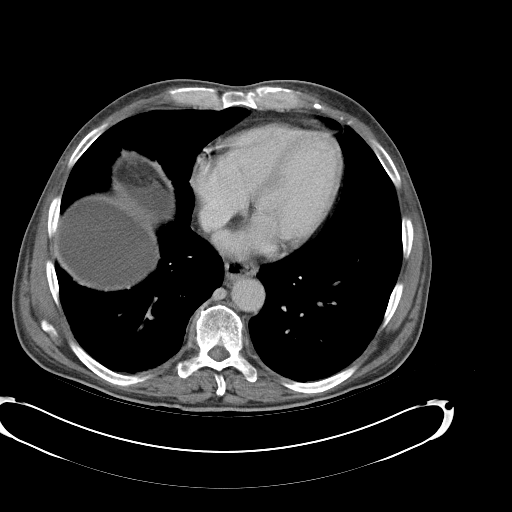
[im 87/93  soft-tissue]
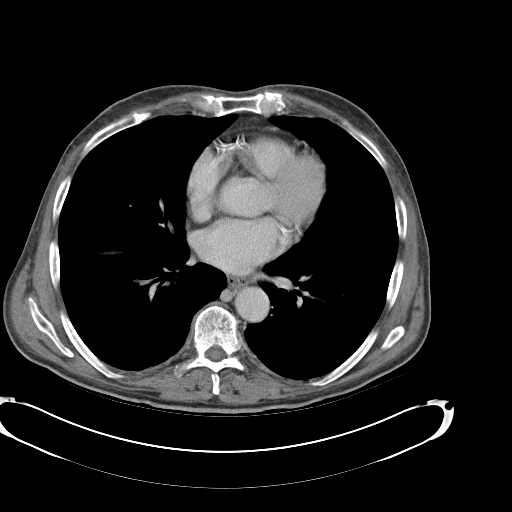

[Series 4: abd_pel_with 3.0 spo · coronal · 0.80mm/px · 3 of 98 slices shown]
[im 33/98  soft-tissue]
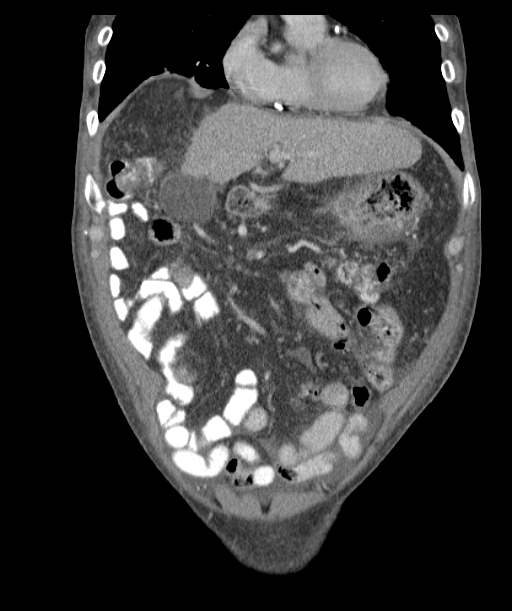
[im 44/98  soft-tissue]
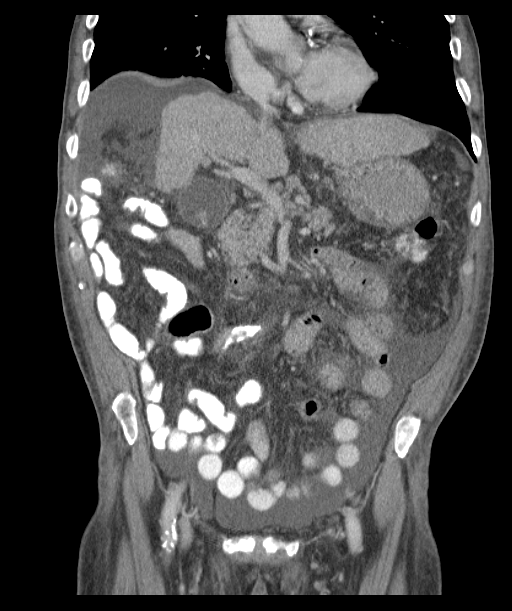
[im 54/98  soft-tissue]
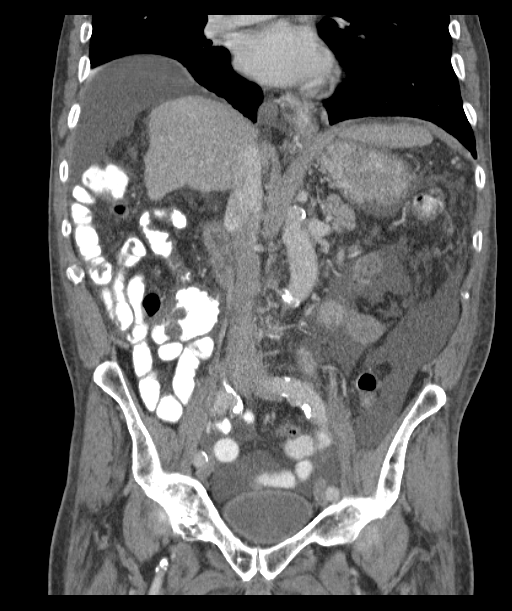

[16 of 46 positions shown; findings below may reference images not displayed]

FINDINGS: Lobulated liver contours with a small right lobe and enlarged
lateral segment left lobe and caudate lobe. Upper abdominal varices.
Moderate amount of free peritoneal fluid.

Multiple gallstones in the gallbladder measuring up to 1.6 cm in
diameter each. No gallbladder wall thickening or pericholecystic
fluid.

Small amount of atelectasis at both lung bases, greater on the
right. Surgically absent spleen. Unremarkable pancreas, adrenal
glands, kidneys and urinary bladder. Mildly enlarged prostate gland
containing coarse calcifications.

No gastrointestinal abnormalities or enlarged lymph nodes.
Atheromatous arterial calcifications. Lumbar and lower thoracic
spine degenerative changes, including facet degenerative changes
with associated grade 1 anterolisthesis at the L4-5 level and grade
1 retrolisthesis at the L1-2 and L2-3 levels.
IMPRESSION: 1. Changes of cirrhosis of the liver with upper abdominal varices
and moderate amount of ascites.
2. Status post splenectomy.
3. Cholelithiasis.
